# Patient Record
Sex: Female | Born: 1955 | Race: White | Hispanic: No | Marital: Married | Smoking: Never smoker
Health system: Southern US, Community
[De-identification: ages and names within clinical notes are randomized; demographics above are authoritative.]

## PROBLEM LIST (undated history)

## (undated) DIAGNOSIS — I341 Nonrheumatic mitral (valve) prolapse: Secondary | ICD-10-CM

## (undated) DIAGNOSIS — G4730 Sleep apnea, unspecified: Secondary | ICD-10-CM

## (undated) DIAGNOSIS — K219 Gastro-esophageal reflux disease without esophagitis: Secondary | ICD-10-CM

## (undated) DIAGNOSIS — M1990 Unspecified osteoarthritis, unspecified site: Secondary | ICD-10-CM

## (undated) DIAGNOSIS — E7800 Pure hypercholesterolemia, unspecified: Secondary | ICD-10-CM

## (undated) DIAGNOSIS — E119 Type 2 diabetes mellitus without complications: Secondary | ICD-10-CM

## (undated) DIAGNOSIS — T4145XA Adverse effect of unspecified anesthetic, initial encounter: Secondary | ICD-10-CM

## (undated) DIAGNOSIS — R0602 Shortness of breath: Secondary | ICD-10-CM

## (undated) DIAGNOSIS — R112 Nausea with vomiting, unspecified: Secondary | ICD-10-CM

## (undated) DIAGNOSIS — E039 Hypothyroidism, unspecified: Secondary | ICD-10-CM

## (undated) DIAGNOSIS — I499 Cardiac arrhythmia, unspecified: Secondary | ICD-10-CM

## (undated) DIAGNOSIS — M722 Plantar fascial fibromatosis: Secondary | ICD-10-CM

## (undated) DIAGNOSIS — J45909 Unspecified asthma, uncomplicated: Secondary | ICD-10-CM

## (undated) DIAGNOSIS — E079 Disorder of thyroid, unspecified: Secondary | ICD-10-CM

## (undated) DIAGNOSIS — Z9889 Other specified postprocedural states: Secondary | ICD-10-CM

## (undated) DIAGNOSIS — T8859XA Other complications of anesthesia, initial encounter: Secondary | ICD-10-CM

## (undated) HISTORY — DX: Pure hypercholesterolemia, unspecified: E78.00

## (undated) HISTORY — PX: APPENDECTOMY: SHX54

## (undated) HISTORY — PX: TOE SURGERY: SHX1073

## (undated) HISTORY — PX: GANGLION CYST EXCISION: SHX1691

## (undated) HISTORY — DX: Disorder of thyroid, unspecified: E07.9

## (undated) HISTORY — DX: Plantar fascial fibromatosis: M72.2

## (undated) HISTORY — DX: Type 2 diabetes mellitus without complications: E11.9

## (undated) HISTORY — PX: ROTATOR CUFF REPAIR: SHX139

## (undated) HISTORY — PX: CYST REMOVAL LEG: SHX6280

## (undated) HISTORY — DX: Nonrheumatic mitral (valve) prolapse: I34.1

---

## 1998-12-25 ENCOUNTER — Other Ambulatory Visit: Admission: RE | Admit: 1998-12-25 | Discharge: 1998-12-25 | Payer: Self-pay | Admitting: Obstetrics and Gynecology

## 1999-04-14 HISTORY — PX: LAPAROSCOPIC SALPINGO OOPHERECTOMY: SHX5927

## 1999-09-24 ENCOUNTER — Encounter: Payer: Self-pay | Admitting: Specialist

## 1999-09-24 ENCOUNTER — Encounter: Admission: RE | Admit: 1999-09-24 | Discharge: 1999-09-24 | Payer: Self-pay | Admitting: Specialist

## 2000-01-08 ENCOUNTER — Other Ambulatory Visit: Admission: RE | Admit: 2000-01-08 | Discharge: 2000-01-08 | Payer: Self-pay | Admitting: Obstetrics and Gynecology

## 2000-01-30 ENCOUNTER — Ambulatory Visit (HOSPITAL_COMMUNITY): Admission: RE | Admit: 2000-01-30 | Discharge: 2000-01-30 | Payer: Self-pay | Admitting: Obstetrics and Gynecology

## 2001-01-10 ENCOUNTER — Other Ambulatory Visit: Admission: RE | Admit: 2001-01-10 | Discharge: 2001-01-10 | Payer: Self-pay | Admitting: Obstetrics and Gynecology

## 2001-09-06 ENCOUNTER — Ambulatory Visit (HOSPITAL_COMMUNITY): Admission: RE | Admit: 2001-09-06 | Discharge: 2001-09-06 | Payer: Self-pay | Admitting: Surgery

## 2001-09-06 ENCOUNTER — Encounter: Payer: Self-pay | Admitting: Surgery

## 2002-01-16 ENCOUNTER — Other Ambulatory Visit: Admission: RE | Admit: 2002-01-16 | Discharge: 2002-01-16 | Payer: Self-pay | Admitting: Obstetrics and Gynecology

## 2002-05-21 ENCOUNTER — Encounter: Payer: Self-pay | Admitting: Emergency Medicine

## 2002-05-21 ENCOUNTER — Emergency Department (HOSPITAL_COMMUNITY): Admission: EM | Admit: 2002-05-21 | Discharge: 2002-05-21 | Payer: Self-pay | Admitting: Emergency Medicine

## 2002-06-02 ENCOUNTER — Encounter: Payer: Self-pay | Admitting: Family Medicine

## 2002-06-02 ENCOUNTER — Encounter: Admission: RE | Admit: 2002-06-02 | Discharge: 2002-06-02 | Payer: Self-pay | Admitting: Family Medicine

## 2002-06-12 HISTORY — PX: BACK SURGERY: SHX140

## 2002-06-13 ENCOUNTER — Inpatient Hospital Stay (HOSPITAL_COMMUNITY): Admission: RE | Admit: 2002-06-13 | Discharge: 2002-06-18 | Payer: Self-pay | Admitting: Neurosurgery

## 2002-06-13 ENCOUNTER — Encounter: Payer: Self-pay | Admitting: Neurosurgery

## 2003-01-29 ENCOUNTER — Other Ambulatory Visit: Admission: RE | Admit: 2003-01-29 | Discharge: 2003-01-29 | Payer: Self-pay | Admitting: Obstetrics and Gynecology

## 2004-02-04 ENCOUNTER — Other Ambulatory Visit: Admission: RE | Admit: 2004-02-04 | Discharge: 2004-02-04 | Payer: Self-pay | Admitting: Obstetrics and Gynecology

## 2005-02-09 ENCOUNTER — Other Ambulatory Visit: Admission: RE | Admit: 2005-02-09 | Discharge: 2005-02-09 | Payer: Self-pay | Admitting: Obstetrics and Gynecology

## 2005-06-10 ENCOUNTER — Encounter: Admission: RE | Admit: 2005-06-10 | Discharge: 2005-06-10 | Payer: Self-pay | Admitting: Family Medicine

## 2006-02-15 ENCOUNTER — Other Ambulatory Visit: Admission: RE | Admit: 2006-02-15 | Discharge: 2006-02-15 | Payer: Self-pay | Admitting: Obstetrics & Gynecology

## 2007-02-21 ENCOUNTER — Other Ambulatory Visit: Admission: RE | Admit: 2007-02-21 | Discharge: 2007-02-21 | Payer: Self-pay | Admitting: Obstetrics & Gynecology

## 2008-02-03 LAB — CONVERTED CEMR LAB: Glucose: 134 mg/dL

## 2008-02-28 ENCOUNTER — Other Ambulatory Visit: Admission: RE | Admit: 2008-02-28 | Discharge: 2008-02-28 | Payer: Self-pay | Admitting: Obstetrics and Gynecology

## 2008-03-22 ENCOUNTER — Ambulatory Visit: Payer: Self-pay | Admitting: Family Medicine

## 2008-03-22 DIAGNOSIS — E785 Hyperlipidemia, unspecified: Secondary | ICD-10-CM

## 2008-03-22 DIAGNOSIS — E119 Type 2 diabetes mellitus without complications: Secondary | ICD-10-CM

## 2008-03-28 ENCOUNTER — Encounter: Payer: Self-pay | Admitting: Family Medicine

## 2008-05-01 ENCOUNTER — Ambulatory Visit: Payer: Self-pay | Admitting: Family Medicine

## 2008-06-07 ENCOUNTER — Emergency Department (HOSPITAL_BASED_OUTPATIENT_CLINIC_OR_DEPARTMENT_OTHER): Admission: EM | Admit: 2008-06-07 | Discharge: 2008-06-07 | Payer: Self-pay | Admitting: Emergency Medicine

## 2009-03-20 ENCOUNTER — Encounter: Admission: RE | Admit: 2009-03-20 | Discharge: 2009-03-20 | Payer: Self-pay | Admitting: Family Medicine

## 2009-03-27 ENCOUNTER — Encounter: Admission: RE | Admit: 2009-03-27 | Discharge: 2009-03-27 | Payer: Self-pay | Admitting: Family Medicine

## 2009-04-09 ENCOUNTER — Ambulatory Visit: Payer: Self-pay | Admitting: Hematology and Oncology

## 2009-04-17 ENCOUNTER — Ambulatory Visit (HOSPITAL_COMMUNITY)
Admission: RE | Admit: 2009-04-17 | Discharge: 2009-04-17 | Payer: Self-pay | Source: Home / Self Care | Admitting: Hematology and Oncology

## 2009-04-17 LAB — CBC WITH DIFFERENTIAL/PLATELET
EOS%: 4.3 % (ref 0.0–7.0)
HCT: 39.9 % (ref 34.8–46.6)
MCH: 34.4 pg — ABNORMAL HIGH (ref 25.1–34.0)
MCV: 97.6 fL (ref 79.5–101.0)
MONO#: 0.3 10*3/uL (ref 0.1–0.9)
MONO%: 6.9 % (ref 0.0–14.0)
NEUT#: 2 10*3/uL (ref 1.5–6.5)
RBC: 4.09 10*6/uL (ref 3.70–5.45)
WBC: 3.7 10*3/uL — ABNORMAL LOW (ref 3.9–10.3)

## 2009-04-17 LAB — COMPREHENSIVE METABOLIC PANEL
CO2: 28 mEq/L (ref 19–32)
Calcium: 9.3 mg/dL (ref 8.4–10.5)
Potassium: 4.1 mEq/L (ref 3.5–5.3)
Sodium: 141 mEq/L (ref 135–145)
Total Bilirubin: 0.4 mg/dL (ref 0.3–1.2)
Total Protein: 6.6 g/dL (ref 6.0–8.3)

## 2009-04-17 LAB — MORPHOLOGY: RBC Comments: NORMAL

## 2009-04-22 LAB — RHEUMATOID FACTOR: Rhuematoid fact SerPl-aCnc: <20 IU/mL (ref 0–20)

## 2009-04-22 LAB — PROTEIN ELECTROPHORESIS, SERUM, WITH REFLEX
Albumin ELP: 63.8 % (ref 55.8–66.1)
Alpha-1-Globulin: 4 % (ref 2.9–4.9)
Beta 2: 4.8 % (ref 3.2–6.5)
Gamma Globulin: 13.4 % (ref 11.1–18.8)
Total Protein, Serum Electrophoresis: 6.8 g/dL (ref 6.0–8.3)

## 2009-04-22 LAB — ANA: Anti Nuclear Antibody(ANA): NEGATIVE

## 2009-04-22 LAB — FOLATE: Folate: >20 ng/mL

## 2009-04-22 LAB — ANTI-NEUTROPHILIC CYTOPLASMIC ANTIBODY PANEL: ANCA Screen: NEGATIVE

## 2009-04-25 ENCOUNTER — Ambulatory Visit (HOSPITAL_COMMUNITY): Admission: RE | Admit: 2009-04-25 | Discharge: 2009-04-25 | Payer: Self-pay | Admitting: Hematology and Oncology

## 2009-07-22 ENCOUNTER — Encounter: Admission: RE | Admit: 2009-07-22 | Discharge: 2009-07-22 | Payer: Self-pay | Admitting: Sports Medicine

## 2009-09-27 ENCOUNTER — Ambulatory Visit: Payer: Self-pay | Admitting: Hematology and Oncology

## 2009-10-01 LAB — CBC WITH DIFFERENTIAL/PLATELET
BASO%: 1.3 % (ref 0.0–2.0)
Eosinophils Absolute: 0.2 10*3/uL (ref 0.0–0.5)
LYMPH%: 39.3 % (ref 14.0–49.7)
MCH: 32.8 pg (ref 25.1–34.0)
MCHC: 35.3 g/dL (ref 31.5–36.0)
MONO#: 0.3 10*3/uL (ref 0.1–0.9)
MONO%: 9.7 % (ref 0.0–14.0)
RBC: 4.63 10*6/uL (ref 3.70–5.45)
RDW: 12.5 % (ref 11.2–14.5)

## 2009-11-21 ENCOUNTER — Encounter: Admission: RE | Admit: 2009-11-21 | Discharge: 2009-11-21 | Payer: Self-pay | Admitting: Endocrinology

## 2010-05-29 ENCOUNTER — Other Ambulatory Visit: Payer: Self-pay | Admitting: Endocrinology

## 2010-05-29 ENCOUNTER — Other Ambulatory Visit: Payer: Self-pay | Admitting: Gastroenterology

## 2010-05-29 DIAGNOSIS — R110 Nausea: Secondary | ICD-10-CM

## 2010-05-29 DIAGNOSIS — R1013 Epigastric pain: Secondary | ICD-10-CM

## 2010-05-29 DIAGNOSIS — E049 Nontoxic goiter, unspecified: Secondary | ICD-10-CM

## 2010-06-02 ENCOUNTER — Ambulatory Visit
Admission: RE | Admit: 2010-06-02 | Discharge: 2010-06-02 | Disposition: A | Discharge status: Home / Self Care | Payer: PRIVATE HEALTH INSURANCE | Source: Ambulatory Visit | Attending: Gastroenterology | Admitting: Gastroenterology

## 2010-06-02 DIAGNOSIS — R110 Nausea: Secondary | ICD-10-CM

## 2010-06-02 DIAGNOSIS — R1013 Epigastric pain: Secondary | ICD-10-CM

## 2010-06-03 ENCOUNTER — Other Ambulatory Visit (HOSPITAL_COMMUNITY): Payer: Self-pay | Admitting: Gastroenterology

## 2010-06-03 DIAGNOSIS — R1011 Right upper quadrant pain: Secondary | ICD-10-CM

## 2010-06-23 ENCOUNTER — Encounter (HOSPITAL_COMMUNITY)
Admission: RE | Admit: 2010-06-23 | Discharge: 2010-06-23 | Disposition: A | Discharge status: Home / Self Care | Payer: PRIVATE HEALTH INSURANCE | Source: Ambulatory Visit | Attending: Gastroenterology | Admitting: Gastroenterology

## 2010-06-23 DIAGNOSIS — R1011 Right upper quadrant pain: Secondary | ICD-10-CM | POA: Insufficient documentation

## 2010-06-23 MED ORDER — TECHNETIUM TC 99M MEBROFENIN IV KIT
5.1000 | PACK | Freq: Once | INTRAVENOUS | Status: AC | PRN
  Administered 2010-06-23: 5 via INTRAVENOUS

## 2010-06-23 MED ORDER — SINCALIDE 5 MCG IJ SOLR: 0.0200 ug/kg | Freq: Once | INTRAMUSCULAR | Status: DC

## 2010-07-02 ENCOUNTER — Other Ambulatory Visit (HOSPITAL_COMMUNITY): Payer: Self-pay | Admitting: Gastroenterology

## 2010-07-02 DIAGNOSIS — R110 Nausea: Secondary | ICD-10-CM

## 2010-07-03 ENCOUNTER — Ambulatory Visit (HOSPITAL_COMMUNITY)
Admission: RE | Admit: 2010-07-03 | Discharge: 2010-07-03 | Disposition: A | Discharge status: Home / Self Care | Payer: PRIVATE HEALTH INSURANCE | Source: Ambulatory Visit | Attending: Gastroenterology | Admitting: Gastroenterology

## 2010-07-03 ENCOUNTER — Encounter (HOSPITAL_COMMUNITY): Payer: Self-pay

## 2010-07-03 DIAGNOSIS — R110 Nausea: Secondary | ICD-10-CM

## 2010-07-03 DIAGNOSIS — Z9089 Acquired absence of other organs: Secondary | ICD-10-CM | POA: Insufficient documentation

## 2010-07-03 MED ORDER — IOHEXOL 300 MG/ML  SOLN
100.0000 mL | Freq: Once | INTRAMUSCULAR | Status: AC | PRN
  Administered 2010-07-03: 100 mL via INTRAVENOUS

## 2010-07-14 ENCOUNTER — Other Ambulatory Visit (HOSPITAL_COMMUNITY): Payer: Self-pay | Admitting: General Surgery

## 2010-07-16 ENCOUNTER — Encounter (HOSPITAL_COMMUNITY)
Admission: RE | Admit: 2010-07-16 | Discharge: 2010-07-16 | Disposition: A | Discharge status: Home / Self Care | Payer: PRIVATE HEALTH INSURANCE | Source: Ambulatory Visit | Attending: General Surgery | Admitting: General Surgery

## 2010-07-16 ENCOUNTER — Encounter (HOSPITAL_COMMUNITY): Payer: Self-pay

## 2010-07-16 DIAGNOSIS — R109 Unspecified abdominal pain: Secondary | ICD-10-CM | POA: Insufficient documentation

## 2010-07-16 DIAGNOSIS — K219 Gastro-esophageal reflux disease without esophagitis: Secondary | ICD-10-CM | POA: Insufficient documentation

## 2010-07-16 DIAGNOSIS — R112 Nausea with vomiting, unspecified: Secondary | ICD-10-CM | POA: Insufficient documentation

## 2010-07-16 MED ORDER — TECHNETIUM TC 99M SULFUR COLLOID
2.1000 | Freq: Once | INTRAVENOUS | Status: AC | PRN
  Administered 2010-07-16: 2.1 via ORAL

## 2010-07-21 ENCOUNTER — Ambulatory Visit
Admission: RE | Admit: 2010-07-21 | Discharge: 2010-07-21 | Disposition: A | Discharge status: Home / Self Care | Payer: PRIVATE HEALTH INSURANCE | Source: Ambulatory Visit | Attending: Endocrinology | Admitting: Endocrinology

## 2010-07-21 DIAGNOSIS — E049 Nontoxic goiter, unspecified: Secondary | ICD-10-CM

## 2010-07-29 LAB — BASIC METABOLIC PANEL
CO2: 24 mEq/L (ref 19–32)
Calcium: 10 mg/dL (ref 8.4–10.5)
GFR calc non Af Amer: >60 mL/min (ref >60)

## 2010-08-29 NOTE — Discharge Summary (Signed)
   NAMETAYDEM, CAVAGNARO                            ACCOUNT NO.:  [id number]   MEDICAL RECORD NO.:  [id number]                   PATIENT TYPE:  INP   LOCATION:  3006                                 FACILITY:  [facility]   PHYSICIAN:  Stefani Dama, M.D.               DATE OF BIRTH:  09/23/1955   DATE OF ADMISSION:  06/13/2002  DATE OF DISCHARGE:  06/18/2002                                 DISCHARGE SUMMARY   ADMISSION DIAGNOSES:  1. Lumbar spondylolysis.  2. Spondylolisthesis L5-S1.  3. Herniated nucleus pulposus L5-S1, with lumbar radiculopathy.   DISCHARGE DIAGNOSES:  1. Lumbar spondylolysis.  2. Spondylolisthesis L5-S1.  3. Herniated nucleus pulposus L5-S1, with lumbar radiculopathy.   OPERATION:  An L5 Gill procedure, diskectomy with interbody arthrodesis and  stabilization with non-segmental pedicle fixation, and posterolateral  arthrodesis with autograft and allograft.   CONDITION ON DISCHARGE:  Improving.   HOSPITAL COURSE:  The patient is a 55 year old individual who has had  significant back and leg pain.  She was advised to undergo a surgical  decompression of a spondylolisthesis at the L5-S1 level.  This occurred on  June 13, 2002.  Postoperatively she required some pain medication at first,  and was gradually switched over to oral pain medication.   DISPOSITION:  At the time of discharge she is tolerating Percocet one tab  q.3-4h. p.r.n. pain control.  Her incision is clean and dry.  She is also  given a prescription for Valium 5 mg, #40, without refills.   FOLLOW UP:  She will be seen in the office in one week's time.   CONDITION ON DISCHARGE:  Her incision is clean and dry.  Her condition on  discharge is improved.                                                Stefani Dama, M.D.    Merla Riches  D:  06/18/2002  T:  06/19/2002  Job:  [id number]

## 2010-08-29 NOTE — Op Note (Signed)
Alexandria Parker, Alexandria Parker                            ACCOUNT NO.:  [id number]   MEDICAL RECORD NO.:  [id number]                   PATIENT TYPE:  INP   LOCATION:  2899                                 FACILITY:  [facility]   PHYSICIAN:  Danae Orleans. Venetia Maxon, M.D.               DATE OF BIRTH:  April 28, 1955   DATE OF PROCEDURE:  06/13/2002  DATE OF DISCHARGE:                                 OPERATIVE REPORT   PREOPERATIVE DIAGNOSES:  1. L5 spondylolysis.  2. Spondylolisthesis, L5-S1.  3. Herniated lumbar disk, L5-S1.  4. Lumbar radiculopathy.   POSTOPERATIVE DIAGNOSES:  1. L5 spondylolysis.  2. Spondylolisthesis, L5-S1.  3. Herniated lumbar disk, L5-S1.  4. Lumbar radiculopathy.   PROCEDURES:  1. L5 Gill procedure.  2. L5-S1 diskectomy with transverse lumbar interbody fusion (11 mm Synthes     bone dowel with morcellized autograft).  3. Pedicle screw fixation, L5 through S1 bilaterally, using FD90 5.75 x 40     mm screws.  4. Posterolateral arthrodesis, L5-S1, with morcellized autograft and 10 mL     of Vitoss.   SURGEON:  Danae Orleans. Venetia Maxon, M.D.   ASSISTANT:  Payton Doughty, M.D.   ANESTHESIA:  General endotracheal anesthesia.   ESTIMATED BLOOD LOSS:  200 mL.   COMPLICATIONS:  None.   DISPOSITION:  To recovery.   INDICATIONS:  The patient is a 55 year old woman with a large free-fragment  disk herniation from the L5-S1 interspace causing compression of the left L5  nerve root with significant L5 radiculopathy.  She has grade 1-2  spondylolisthesis of L5 on S1 with spondylolysis of L5, which is mobile on  flexion-extension views.  It was therefore elected to take her to surgery  for decompression and fixation as well as diskectomy.   DESCRIPTION OF PROCEDURE:  The patient was brought to the operating room.  Following the satisfactory and uncomplicated induction of general  endotracheal anesthesia and placement of intravenous lines, the patient  placed in a prone position on the operating  table.  The low back was prepped  and draped in the usual sterile fashion.  The area of planned incision was  infiltrated with 0.25% Marcaine and 0.5% lidocaine with 1:200,000 strength  of epinephrine.  The incision was made in the midline, carried through  copious adipose tissue to the lumbar dorsal fascia, which was incised using  electrocautery.  Subperiosteal dissection was then performed exposing the L5  transverse processes and the sacral alae.  Both of these were decorticated  and stripped of periosteum for later bone grafting.  The Versatrac retractor  was placed to facilitate exposure.  The L5 posterior elements were extremely  loose, and there was clear evidence of a pars defect on both sides of L5.  An intraoperative x-ray was obtained with marker probes at the L5 and sacral  pedicles, which was confirmed.  Subsequently an L5 Gill procedure was  performed  where the posterior elements of L5 were removed and subsequently  the thecal sac was identified and decompressed, as were both L5 and S1 nerve  roots.  On the left side the thecal sac was mobilized with D'Errico nerve  root retractor under loupe magnification, and multiple large fragments of  herniated disk material were identified and removed.  These fragments were  directly underlying the L5 nerve root, and removal of these fragments led to  significant decompression of the L5 nerve root.  There was an obvious rent  in the annulus, and more disk material was removed from this.  Subsequently  the laminar spreader was placed on the right side of the midline and the  interspace was then entered with a 15 blade and then disk material was  removed in a piecemeal fashion.  Using a variety of Synthes curettes and box  cutting curettes, the end plates were stripped of residual disk material.  The fluoroscopy unit was then brought into the field and using  intraoperative fluoroscopy, an 11 mm bone dowel trial sizer was placed and  this  was found to fit appropriately.  The 11 mm Synthes T-LIF dowel was then  reconstituted in kanamycin and saline, and this was inserted in the  interspace and countersunk appropriately.  Intraoperative x-ray confirmed  positioning of bone graft.  Subsequently morcellized bone autograft which  had been chopped up from the L5 posterior elements which had been previously  harvested was then inserted in the interspace and counter sunk  appropriately.  Subsequently pedicle screws were placed, 40 x 5.75 mm screws  were placed, one in each S1 pedicle and an additional screw at each L5  pedicle.  All screws had excellent purchase.  The tracks of the screws were  palpated with a probe and found to have no cut-out, and positioning of  screws was confirmed on lateral and AP fluoroscopy.  The 60 mm rod was then  cut in half and each little rod was lordosed somewhat.  Morcellized bone was  admixed with 10 mL of Vitoss, which was reconstituted with blood aspirated  from the track of the pedicle screws, and a tap was inserted over each  sacral ala and L5 transverse process.  The rods were then placed and locked  down in situ.  Prior to placing bone graft, the wound had been copiously  irrigated with kanamycin and saline.  The self-retaining retractor was then  removed.  The lumbodorsal fascia was closed with 0 Vicryl suture, the  subcutaneous tissue was reapproximated with 2-0 Vicryl interrupted inverted  sutures, and the skin edges were reapproximated with interrupted 3-0 Vicryl  subcuticular stitch.  The wound was dressed with Benzoin, Steri-Strips,  Telfa gauze, and tape.  The patient was extubated in the operating room and  taken to the recovery room in stable and satisfactory condition, having  tolerated her operation well.  Counts were correct at the end of the case.                                               Danae Orleans. Venetia Maxon, M.D.   JDS/MEDQ  D:  06/13/2002  T:  06/13/2002  Job:  [id number]

## 2010-12-02 ENCOUNTER — Other Ambulatory Visit: Payer: Self-pay | Admitting: Endocrinology

## 2010-12-02 DIAGNOSIS — E049 Nontoxic goiter, unspecified: Secondary | ICD-10-CM

## 2011-05-25 ENCOUNTER — Ambulatory Visit
Admission: RE | Admit: 2011-05-25 | Discharge: 2011-05-25 | Disposition: A | Discharge status: Home / Self Care | Payer: PRIVATE HEALTH INSURANCE | Source: Ambulatory Visit | Attending: Endocrinology | Admitting: Endocrinology

## 2011-05-25 DIAGNOSIS — E049 Nontoxic goiter, unspecified: Secondary | ICD-10-CM

## 2011-10-07 IMAGING — NM NM HEPATO W/GB/PHARM/[PERSON_NAME]
3 series · 18 of 18 positions shown · non-contrast
Comparison: None.

***ADDENDUM*** CREATED: 08/12/2010 [DATE]

Due to an administrative error, the original report had the
incorrect  exam title.  Please see below for the corrected exam
title.
NM HEPATOBILIARY INCLUDE GB
***END ADDENDUM*** SIGNED BY: Uziel Selders, M.D.
CLINICAL DATA: Right upper quadrant pain
RADIOLOGY EXAMINATION
Radiopharmaceutical: 5.1 mCi technetium Choletec and 1.6 micro
grams CCK intravenously.

[Series 1: he hepato · 4.75mm/px · 6 of 60 frames shown (1 of 3)]
[frame 6/60]
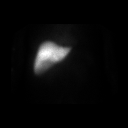
[frame 16/60]
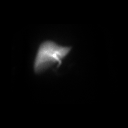
[frame 26/60]
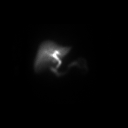
[frame 36/60]
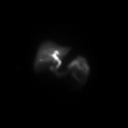
[frame 46/60]
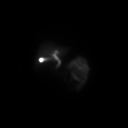
[frame 56/60]
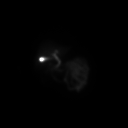

[Series 1: he hepato · 4.75mm/px · 6 of 30 frames shown (2 of 3)]
[frame 3/30]
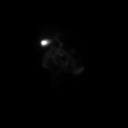
[frame 8/30]
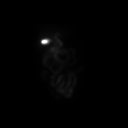
[frame 13/30]
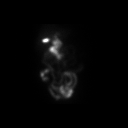
[frame 18/30]
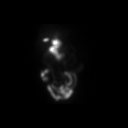
[frame 23/30]
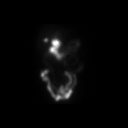
[frame 28/30]
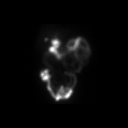

[Series 1: he hepato · 4.75mm/px · 6 of 12 frames shown (3 of 3)]
[frame 2/12]
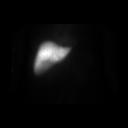
[frame 4/12]
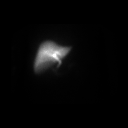
[frame 6/12]
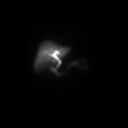
[frame 8/12]
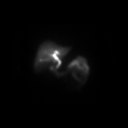
[frame 10/12]
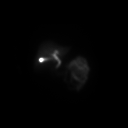
[frame 12/12]
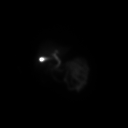

[18 of 18 positions shown; findings below may reference images not displayed]

FINDINGS: There is prompt visualization of the bile ducts,
gallbladder, and small bowel.  The ejection fraction is calculated
at 94.6%.  Normal is greater than 30%.  The patient did complain of
abdominal cramps during CCK infusion, but no specific right upper
quadrant pain.
IMPRESSION: 1.  Patent cystic and common bile ducts.
2.  The gallbladder contracts physiologically.
3.  The patient reported abdominal cramping  during CCK infusion.

## 2011-11-04 IMAGING — US US SOFT TISSUE HEAD/NECK
1 series · 14 of 25 positions shown · non-contrast
Comparison: 11/21/2009

CLINICAL DATA: Follow up goiter.

THYROID ULTRASOUND
TECHNIQUE: Ultrasound examination of the thyroid gland and adjacent
soft tissues was performed.

[Series 1: us soft tissue head/neck · 0.08mm/px · 14 of 54 slices shown]
[im 1/54]
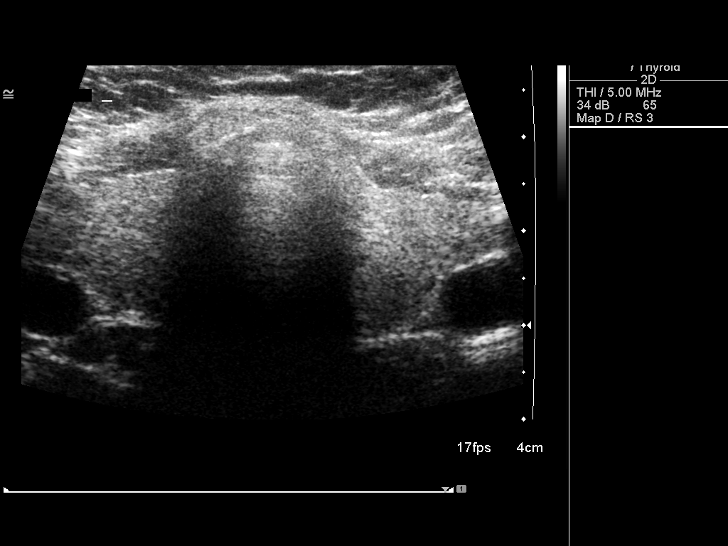
[im 5/54]
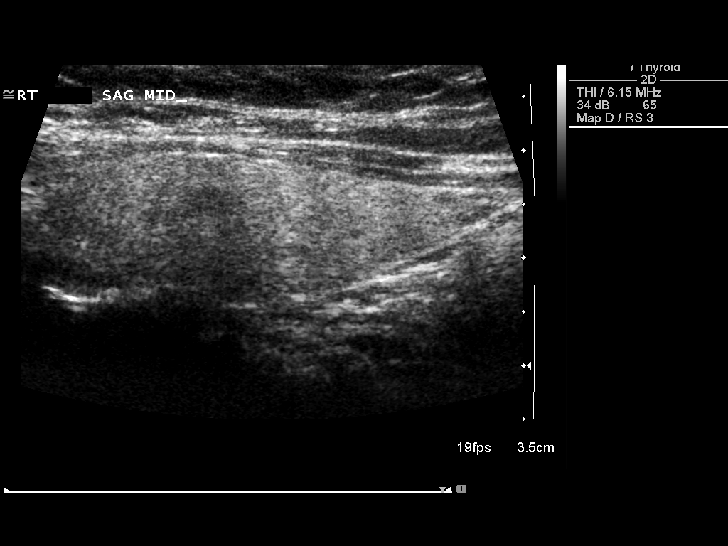
[im 9/54]
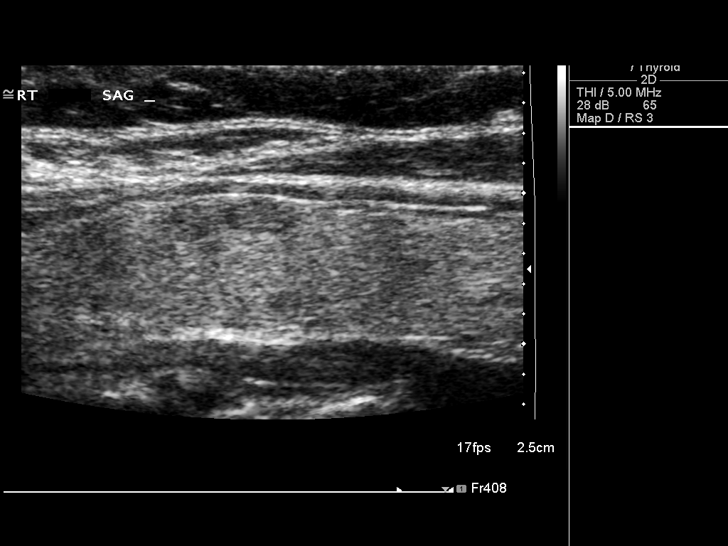
[im 14/54]
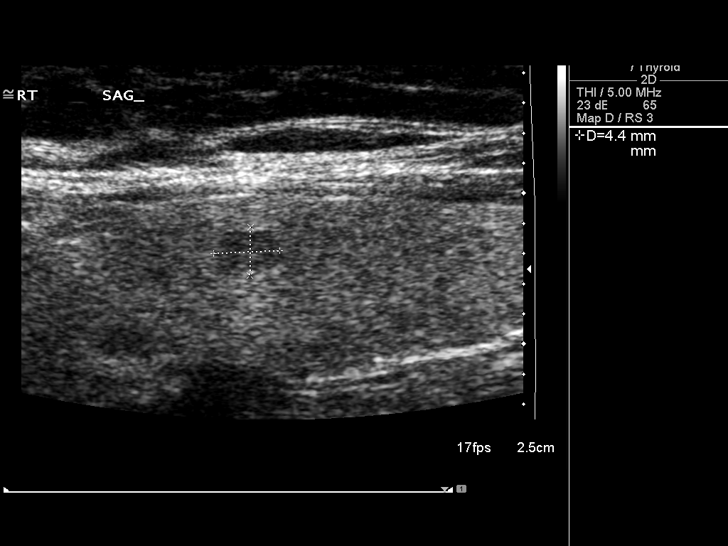
[im 18/54]
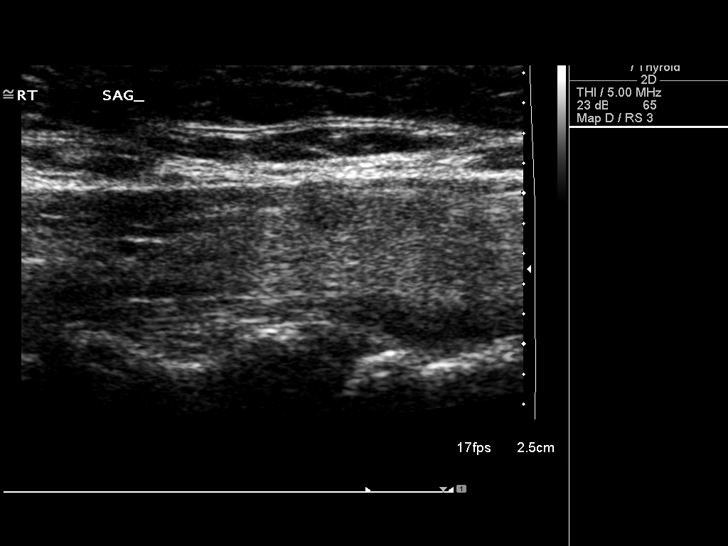
[im 20/54]
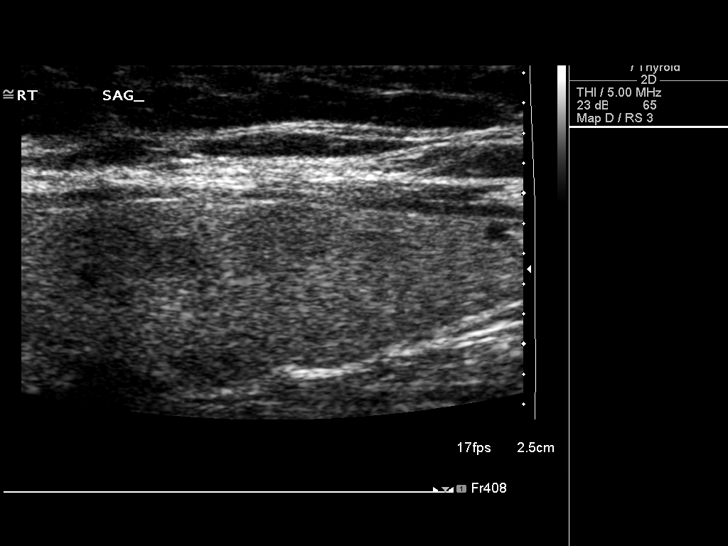
[im 25/54]
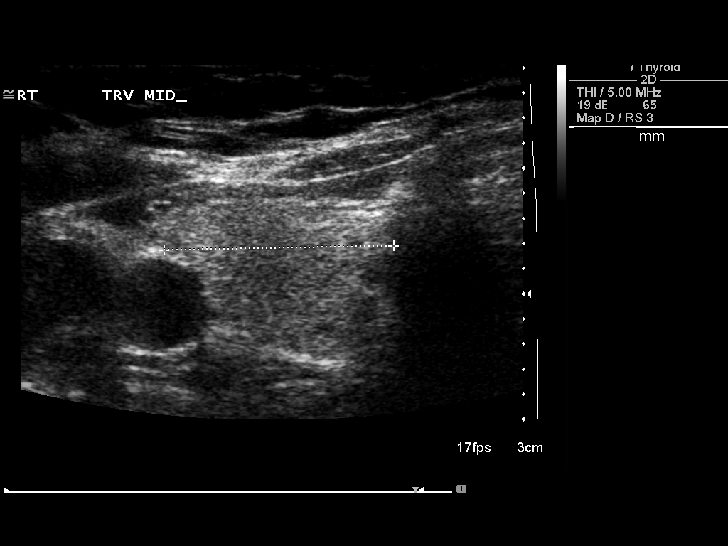
[im 29/54]
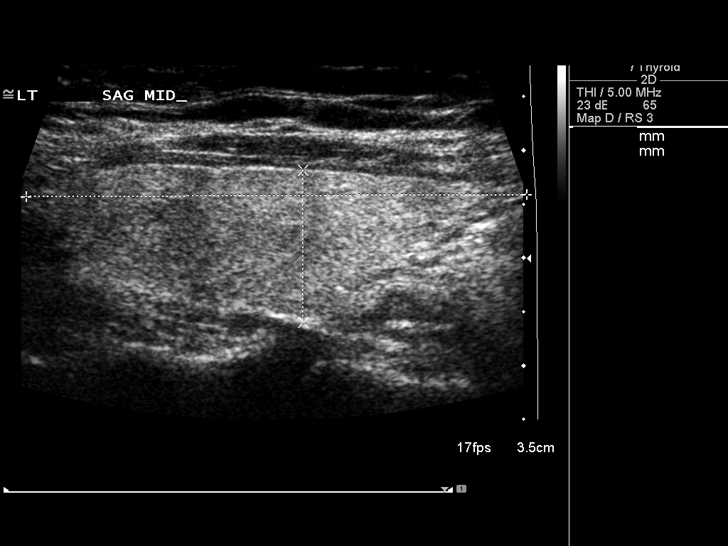
[im 34/54]
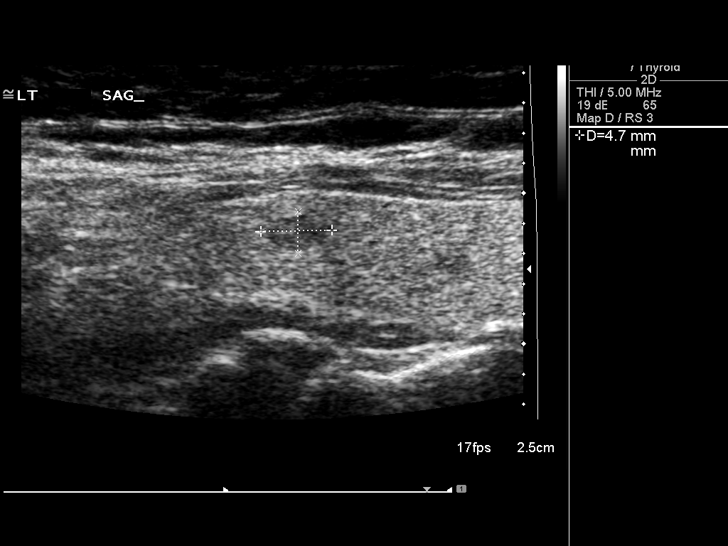
[im 36/54]
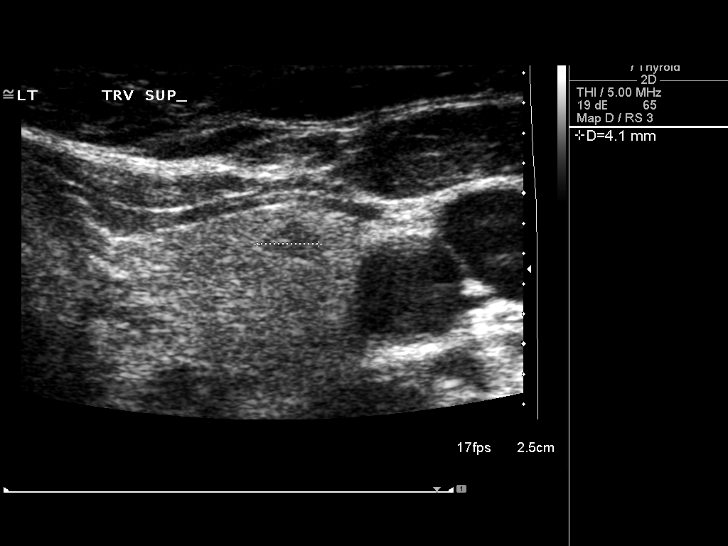
[im 40/54]
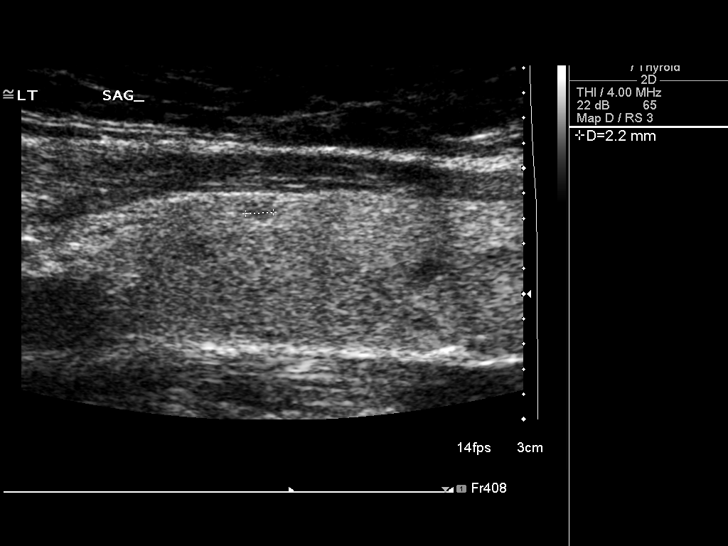
[im 45/54]
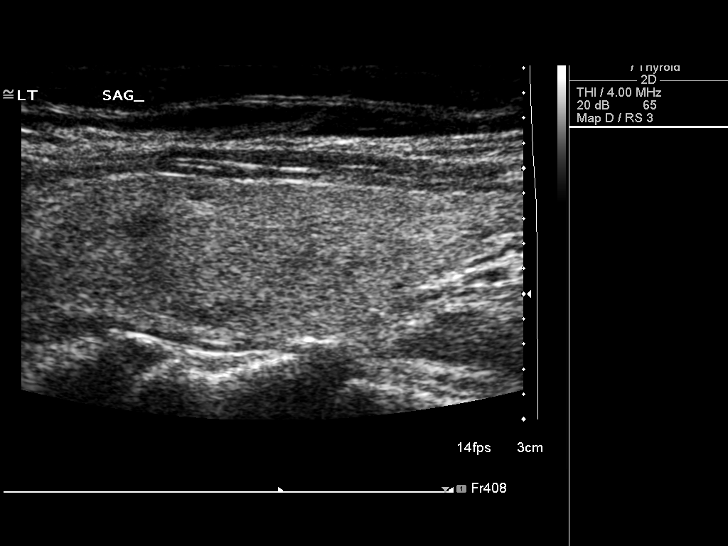
[im 49/54]
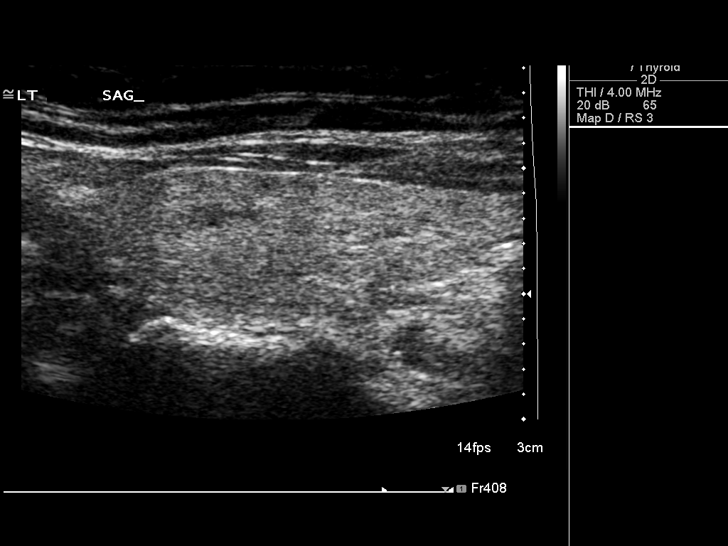
[im 54/54]
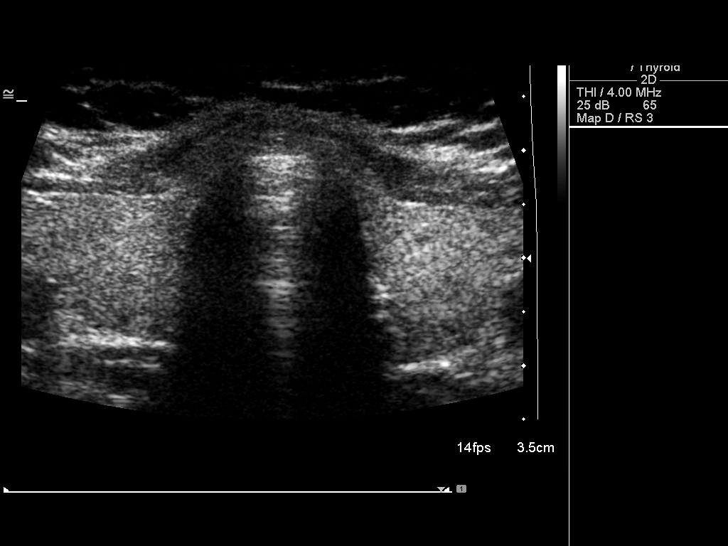

[14 of 25 positions shown; findings below may reference images not displayed]

FINDINGS: Right thyroid lobe:  Right thyroid measures 4.5 x 1.4 x 1.8 cm.
Left thyroid lobe:  Left thyroid measures 4.7 x 1.4 x 2.0 cm.
Isthmus:  4 mm in thickness

Focal nodules:  Multiple bilateral hypoechoic nodules bilaterally,
some of which may be solid, although none of which measure greater
than 5 mm. When compared to the prior study, there is no interval
change.

Lymphadenopathy:  None visualized.
IMPRESSION: Multiple small hypoechoic nodules measuring up to 5 mm bilaterally,
unchanged.

## 2012-06-28 ENCOUNTER — Encounter: Payer: Self-pay | Admitting: *Deleted

## 2012-06-29 ENCOUNTER — Encounter: Payer: Self-pay | Admitting: Obstetrics and Gynecology

## 2012-06-29 ENCOUNTER — Ambulatory Visit (INDEPENDENT_AMBULATORY_CARE_PROVIDER_SITE_OTHER): Payer: PRIVATE HEALTH INSURANCE | Admitting: Obstetrics and Gynecology

## 2012-06-29 VITALS — BP 118/70 | Ht 64.25 in | Wt 178.0 lb

## 2012-06-29 DIAGNOSIS — Z01419 Encounter for gynecological examination (general) (routine) without abnormal findings: Secondary | ICD-10-CM

## 2012-06-29 DIAGNOSIS — N801 Endometriosis of ovary: Secondary | ICD-10-CM | POA: Insufficient documentation

## 2012-06-29 MED ORDER — PROGESTERONE MICRONIZED 100 MG PO CAPS: 100.0000 mg | ORAL_CAPSULE | Freq: Every day | ORAL | Status: DC

## 2012-06-29 MED ORDER — ESTRADIOL 0.05 MG/24HR TD PTTW: 1.0000 | MEDICATED_PATCH | TRANSDERMAL | Status: DC

## 2012-06-29 NOTE — Patient Instructions (Signed)
We recommended that you start or continue a regular exercise program for good health. Regular exercise means any activity that makes your heart beat faster and makes you sweat.  We recommend exercising at least 30 minutes per day at least 3 days a week, preferably 4 or 5.  We also recommend a diet low in fat and sugar.  Inactivity, poor dietary choices and obesity can cause diabetes, heart attack, stroke, and kidney damage, among others.    All women over 40 years old should have a yearly mammogram. Many facilities now offer a "3D" mammogram, which may cost around $50 extra out of pocket. If possible,  we recommend you accept the option to have the 3D mammogram performed.  It both reduces the number of women who will be called back for extra views which then turn out to be normal, and it is better than the routine mammogram at detecting truly abnormal areas.    Women should limit their alcohol intake to no more than 7 drinks/beers/glasses of wine (combined, not each!) per week. Moderation of alcohol intake to this level decreases your risk of breast cancer and liver damage. And of course, no recreational drugs are part of a healthy lifestyle.  And absolutely no smoking or even second hand smoke. Most people know smoking can cause heart and lung diseases, but did you know it also contributes to weakening of your bones? Aging of your skin?  Yellowing of your teeth and nails?  Pap smears, to check for cervical cancer or precancers,  have traditionally been done yearly, although recent scientific advances have shown that most women can have pap smears less often.  However, every woman still should have a physical exam from her gynecologist every year. It will include a breast check, inspection of the vulva and vagina to check for abnormal growths or skin changes, a visual exam of the cervix, and then an exam to evaluate the size and shape of the uterus and ovaries.  And after 56 years of age, a rectal exam is  indicated to check for rectal cancers. We will also provide age appropriate advice regarding health maintenance, like when you should have certain vaccines, screening for sexually transmitted diseases, bone density testing, colonoscopy, mammograms, etc.   Adequate intake of calcium and Vitamin D are recommended.  The recommendations for exact amounts of these supplements seem to change often, but generally speaking 600 mg of calcium (either carbonate or citrate) and 800 units of Vitamin D per day seems prudent. Certain women may benefit from higher intake of Vitamin D.  If you are among these women, your doctor will have told you during your visit.     

## 2012-06-29 NOTE — Progress Notes (Deleted)
57 y.o. MarriedCaucasian female   G3P2 here for annual exam.    Patient's last menstrual period was 11/12/2010.          Sexually active: yes  The current method of family planning is vasectomy.    Exercising: no  The patient does not participate in regular exercise at present. Last mammogram:  06/15/2012 Smoking:no Alcohol:2-3 times a week Last colonoscopy:05/2010 Last Bone Density:  03/2008  Hgb:                Urine:   reports that she has never smoked. She does not have any smokeless tobacco history on file. She reports that she drinks about 1.0 ounces of alcohol per week. She reports that she does not use illicit drugs.  Health Maintenance  Topic Date Due  . Influenza Vaccine  12/12/1955  . Tetanus/tdap  12/11/1975  . Colonoscopy  12/11/2006  . Pap Smear  05/11/2014  . Mammogram  06/16/2014    Family History  Problem Relation Age of Onset  . Diabetes Mother   . Hyperlipidemia Father     Patient Active Problem List  Diagnosis  . AODM  . OTHER AND UNSPECIFIED HYPERLIPIDEMIA    Past Medical History  Diagnosis Date  . Diabetes mellitus   . Diabetes   . MVP (mitral valve prolapse)   . Plantar fasciitis   . Hypercholesteremia   . Thyroid disease     Past Surgical History  Procedure Laterality Date  . Laparoscopic salpingo oopherectomy Right 04/14/1999  . Rotator cuff repair Bilateral 1999/2001  . Cesarean section    . Appendectomy    . Back surgery  06/2002    L5 HNP  . Cyst removal leg Left     thigh,Lt wrist    Allergies: Benadryl; Codeine; Erythromycin; and Nsaids  Current Outpatient Prescriptions  Medication Sig Dispense Refill  . ALPRAZolam (XANAX) 1 MG tablet Take 1 mg by mouth as needed for sleep or anxiety (1-2 tabs).      . Cholecalciferol (VITAMIN D3) 2000 UNITS TABS Take 2,000 Units by mouth daily.      . clonazePAM (KLONOPIN) 0.5 MG tablet Take 0.5 mg by mouth at bedtime as needed for anxiety (1-2 tabs).      . CRESTOR 20 MG tablet Take 20 mg by  mouth daily.       Marland Kitchen Dexlansoprazole 30 MG capsule Take 30 mg by mouth daily.      Marland Kitchen estradiol (MINIVELLE) 0.05 MG/24HR Place 1 patch onto the skin 2 (two) times a week.      . fish oil-omega-3 fatty acids 1000 MG capsule Take 2 g by mouth 3 (three) times daily.      . Glucosamine Sulfate 750 MG CAPS Take 750 mg by mouth 2 (two) times daily.      Marland Kitchen levothyroxine (SYNTHROID, LEVOTHROID) 25 MCG tablet Take 25 mcg by mouth daily.      Marland Kitchen lisinopril (PRINIVIL,ZESTRIL) 2.5 MG tablet Take 2.5 mg by mouth daily.      . metFORMIN (GLUMETZA) 500 MG (MOD) 24 hr tablet Take 1,000 mg by mouth 2 (two) times daily with breakfast and lunch.      . Multiple Vitamin (MULTIVITAMIN WITH MINERALS) TABS Take 1 tablet by mouth 2 (two) times daily.       . progesterone (PROMETRIUM) 100 MG capsule Take 100 mg by mouth at bedtime.      . sertraline (ZOLOFT) 100 MG tablet Take 50 mg by mouth daily.  No current facility-administered medications for this visit.    ROS: Pertinent items are noted in HPI.  Exam:    BP 118/70  Ht 5' 4.25" (1.632 m)  Wt 178 lb (80.74 kg)  BMI 30.31 kg/m2  LMP 11/12/2010   Wt Readings from Last 3 Encounters:  06/29/12 178 lb (80.74 kg)  05/01/08 176 lb 11.2 oz (80.151 kg)  03/22/08 175 lb 3.2 oz (79.47 kg)     Ht Readings from Last 3 Encounters:  06/29/12 5' 4.25" (1.632 m)  03/22/08 5' 4.5" (1.638 m)    General appearance: alert, cooperative and appears stated age Head: Normocephalic, without obvious abnormality, atraumatic Neck: no adenopathy, supple, symmetrical, trachea midline and thyroid not enlarged, symmetric, no tenderness/mass/nodules Lungs: clear to auscultation bilaterally Breasts: Inspection negative, No nipple retraction or dimpling, No nipple discharge or bleeding, No axillary or supraclavicular adenopathy, Normal to palpation without dominant masses Heart: regular rate and rhythm Abdomen: soft, non-tender; bowel sounds normal; no masses,  no  organomegaly Extremities: extremities normal, atraumatic, no cyanosis or edema Skin: Skin color, texture, turgor normal. No rashes or lesions Lymph nodes: Cervical, supraclavicular, and axillary nodes normal. No abnormal inguinal nodes palpated Neurologic: Grossly normal   Pelvic: External genitalia:  no lesions              Urethra:  normal appearing urethra with no masses, tenderness or lesions              Bartholins and Skenes: normal                 Vagina: normal appearing vagina with normal color and discharge, no lesions              Cervix: normal appearance              Pap taken: {yes no:314532}        Bimanual Exam:  Uterus:  uterus is normal size, shape, consistency and nontender                                      Adnexa: normal adnexa in size, nontender and no masses                                      Rectovaginal: Confirms                                      Anus:  normal sphincter tone, no lesions  A: well woman     P: {plan; gyn:[id number]::"mammogram","pap smear","return annually or prn"}     An After Visit Summary was printed and given to the patient.

## 2012-06-29 NOTE — Progress Notes (Signed)
57 y.o. MarriedCaucasian female   G3P2 here for annual exam. A1C's are running about 6.  Sees Dr. Horald Pollen q 6 months.  Likes HRT but would like to not have to take any meds she doesn't have to.  Is on it for maintenance of vulvo-vag health and she feels her brain works better.    Patient's last menstrual period was 11/12/2010.          Sexually active: yes  The current method of family planning is none.    Exercising: no  none Last mammogram:  Two weeks ago Smoking:no Alcohol: 2 -3 times per week Last colonoscopy: 2012 Last Bone Density: 5-6 years ago  Hgb:                Urine:   reports that she has never smoked. She does not have any smokeless tobacco history on file. She reports that she drinks about 1.0 ounces of alcohol per week. She reports that she does not use illicit drugs.  Health Maintenance  Topic Date Due  . Influenza Vaccine  12/12/1955  . Tetanus/tdap  12/11/1975  . Colonoscopy  12/11/2006  . Pap Smear  05/11/2014  . Mammogram  06/16/2014    Family History  Problem Relation Age of Onset  . Diabetes Mother   . Hyperlipidemia Father     Patient Active Problem List  Diagnosis  . AODM  . OTHER AND UNSPECIFIED HYPERLIPIDEMIA    Past Medical History  Diagnosis Date  . Diabetes mellitus   . Diabetes   . MVP (mitral valve prolapse)   . Plantar fasciitis   . Hypercholesteremia   . Thyroid disease   Thyroid nodules followed by Dr. Johney Frame regular thyroid ultrasound.  Past Surgical History  Procedure Laterality Date  . Laparoscopic salpingo oopherectomy Right 04/14/1999  . Rotator cuff repair Bilateral 1999/2001  . Cesarean section    . Appendectomy    . Back surgery  06/2002    L5 HNP  . Cyst removal leg Left     thigh,Lt wrist    Allergies: Benadryl; Codeine; Erythromycin; and Nsaids  Current Outpatient Prescriptions  Medication Sig Dispense Refill  . ALPRAZolam (XANAX) 1 MG tablet Take 1 mg by mouth as needed for sleep or anxiety (1-2 tabs).       . Cholecalciferol (VITAMIN D3) 2000 UNITS TABS Take 2,000 Units by mouth daily.      . clonazePAM (KLONOPIN) 0.5 MG tablet Take 0.5 mg by mouth at bedtime as needed for anxiety (1-2 tabs).      . CRESTOR 20 MG tablet Take 20 mg by mouth daily.       Marland Kitchen Dexlansoprazole 30 MG capsule Take 30 mg by mouth daily.      Marland Kitchen estradiol (MINIVELLE) 0.05 MG/24HR Place 1 patch onto the skin 2 (two) times a week.      . fish oil-omega-3 fatty acids 1000 MG capsule Take 2 g by mouth 3 (three) times daily.      . Glucosamine Sulfate 750 MG CAPS Take 750 mg by mouth 2 (two) times daily.      Marland Kitchen levothyroxine (SYNTHROID, LEVOTHROID) 25 MCG tablet Take 25 mcg by mouth daily.      Marland Kitchen lisinopril (PRINIVIL,ZESTRIL) 2.5 MG tablet Take 2.5 mg by mouth daily.      . metFORMIN (GLUMETZA) 500 MG (MOD) 24 hr tablet Take 1,000 mg by mouth 2 (two) times daily with breakfast and lunch.      . Multiple Vitamin (MULTIVITAMIN  WITH MINERALS) TABS Take 1 tablet by mouth 2 (two) times daily.       . progesterone (PROMETRIUM) 100 MG capsule Take 100 mg by mouth at bedtime.      . sertraline (ZOLOFT) 100 MG tablet Take 50 mg by mouth daily.        No current facility-administered medications for this visit.    ROS: Pertinent items are noted in HPI.  Exam:    BP 118/70  Ht 5' 4.25" (1.632 m)  Wt 178 lb (80.74 kg)  BMI 30.31 kg/m2  LMP 11/12/2010   Wt Readings from Last 3 Encounters:  06/29/12 178 lb (80.74 kg)  05/01/08 176 lb 11.2 oz (80.151 kg)  03/22/08 175 lb 3.2 oz (79.47 kg)     Ht Readings from Last 3 Encounters:  06/29/12 5' 4.25" (1.632 m)  03/22/08 5' 4.5" (1.638 m)    General appearance: alert, cooperative and appears stated age Head: Normocephalic, without obvious abnormality, atraumatic Neck: no adenopathy, supple, symmetrical, trachea midline and thyroid not enlarged, symmetric, no tenderness/mass/nodules Lungs: clear to auscultation bilaterally Breasts: Inspection negative, No nipple retraction or  dimpling, No nipple discharge or bleeding, No axillary or supraclavicular adenopathy, Normal to palpation without dominant masses Heart: regular rate and rhythm Abdomen: soft, non-tender; bowel sounds normal; no masses,  no organomegaly Extremities: extremities normal, atraumatic, no cyanosis or edema Skin: Skin color, texture, turgor normal. No rashes or lesions Lymph nodes: Cervical, supraclavicular, and axillary nodes normal. No abnormal inguinal nodes palpated Neurologic: Grossly normal   Pelvic: External genitalia:  no lesions              Urethra:  normal appearing urethra with no masses, tenderness or lesions              Bartholins and Skenes: normal                 Vagina: normal appearing vagina with normal color and discharge, no lesions              Cervix: normal appearance              Pap taken: no        Bimanual Exam:  Uterus:  uterus is normal size, shape, consistency and nontender                                      Adnexa: normal adnexa in size, nontender and no masses                                      Rectovaginal: Confirms                                      Anus:  normal sphincter tone, no lesions  A: well woman     P: mammogram return annually or prn     An After Visit Summary was printed and given to the patient.

## 2012-10-06 ENCOUNTER — Other Ambulatory Visit: Payer: Self-pay | Admitting: Otolaryngology

## 2012-10-08 NOTE — Addendum Note (Signed)
Addended by: Kennidy Lamke on: 10/08/2012 11:33 AM   Modules accepted: Orders  

## 2012-10-24 ENCOUNTER — Encounter (HOSPITAL_COMMUNITY): Payer: Self-pay | Admitting: Pharmacy Technician

## 2012-10-24 NOTE — Pre-Procedure Instructions (Signed)
Xandrea Clarey  10/24/2012   Your procedure is scheduled on:  Wednesday, July 23rd.  Report to [facility] at 9:15AM.  Call this number if you have problems the morning of surgery: [phone]   Remember:   Do not eat food or drink liquids after midnight.   Take these medicines the morning of surgery with A SIP OF WATER: cetirizine (ZYRTEC),Dexlansoprazole, levothyroxine (SYNTHROID, LEVOTHROID, sertraline (ZOLOFT).  Stop taking Aspirin, Coumadin, Plavix, Effient and Herbal medications.  Don not take any NSAIDs ie: Ibuprofen,  Advil,Naproxen or any medication containing Aspirin.    Do not wear jewelry, make-up or nail polish.  Do not wear lotions, powders, or perfumes. You may wear deodorant.  Do not shave 48 hours prior to surgery.   Do not bring valuables to the hospital.  [facility] is not responsible for any belongings or valuables.  Contacts, dentures or bridgework may not be worn into surgery.  Leave suitcase in the car. After surgery it may be brought to your room.  For patients admitted to the hospital, checkout time is 11:00 AM the day of discharge.   Patients discharged the day of surgery will not be allowed to drive home.  Name and phone number of your driver: -   Special Instructions: Shower using CHG 2 nights before surgery and the night before surgery.  If you shower the day of surgery use CHG.  Use special wash - you have one bottle of CHG for all showers.  You should use approximately 1/3 of the bottle for each shower.   Please read over the following fact sheets that you were given: Pain Booklet, Coughing and Deep Breathing and Surgical Site Infection Prevention

## 2012-10-25 ENCOUNTER — Encounter (HOSPITAL_COMMUNITY)
Admission: RE | Admit: 2012-10-25 | Discharge: 2012-10-25 | Disposition: A | Discharge status: Home / Self Care | Payer: BC Managed Care – PPO | Source: Ambulatory Visit | Attending: Otolaryngology | Admitting: Otolaryngology

## 2012-10-25 ENCOUNTER — Encounter (HOSPITAL_COMMUNITY): Payer: Self-pay

## 2012-10-25 ENCOUNTER — Encounter (HOSPITAL_COMMUNITY)
Admission: RE | Admit: 2012-10-25 | Discharge: 2012-10-25 | Disposition: A | Discharge status: Home / Self Care | Payer: BC Managed Care – PPO | Source: Ambulatory Visit | Attending: Anesthesiology | Admitting: Anesthesiology

## 2012-10-25 DIAGNOSIS — Z01812 Encounter for preprocedural laboratory examination: Secondary | ICD-10-CM | POA: Insufficient documentation

## 2012-10-25 DIAGNOSIS — Z01811 Encounter for preprocedural respiratory examination: Secondary | ICD-10-CM | POA: Insufficient documentation

## 2012-10-25 DIAGNOSIS — Z01818 Encounter for other preprocedural examination: Secondary | ICD-10-CM | POA: Insufficient documentation

## 2012-10-25 DIAGNOSIS — Z01810 Encounter for preprocedural cardiovascular examination: Secondary | ICD-10-CM | POA: Insufficient documentation

## 2012-10-25 HISTORY — DX: Unspecified osteoarthritis, unspecified site: M19.90

## 2012-10-25 HISTORY — DX: Hypothyroidism, unspecified: E03.9

## 2012-10-25 HISTORY — DX: Nausea with vomiting, unspecified: R11.2

## 2012-10-25 HISTORY — DX: Gastro-esophageal reflux disease without esophagitis: K21.9

## 2012-10-25 HISTORY — DX: Other specified postprocedural states: Z98.890

## 2012-10-25 HISTORY — DX: Adverse effect of unspecified anesthetic, initial encounter: T41.45XA

## 2012-10-25 HISTORY — DX: Shortness of breath: R06.02

## 2012-10-25 HISTORY — DX: Sleep apnea, unspecified: G47.30

## 2012-10-25 HISTORY — DX: Cardiac arrhythmia, unspecified: I49.9

## 2012-10-25 HISTORY — DX: Unspecified asthma, uncomplicated: J45.909

## 2012-10-25 HISTORY — DX: Other complications of anesthesia, initial encounter: T88.59XA

## 2012-10-25 LAB — BASIC METABOLIC PANEL
CO2: 29 mEq/L (ref 19–32)
Calcium: 9.4 mg/dL (ref 8.4–10.5)
GFR calc non Af Amer: >90 mL/min (ref >90)
Sodium: 140 mEq/L (ref 135–145)

## 2012-10-25 LAB — CBC
MCV: 93 fL (ref 78.0–100.0)
Platelets: 180 10*3/uL (ref 150–400)
RBC: 4.15 MIL/uL (ref 3.87–5.11)
WBC: 3 10*3/uL — ABNORMAL LOW (ref 4.0–10.5)

## 2012-10-25 NOTE — Progress Notes (Signed)
I spoke with Marchelle Folks at Dr Clovis Pu office- she said "Anesthesia Consult  Is the one that is done routine day of surgery."

## 2012-11-01 MED ORDER — CEFAZOLIN SODIUM-DEXTROSE 2-3 GM-% IV SOLR
2.0000 g | INTRAVENOUS | Status: AC
  Administered 2012-11-02: 2 g via INTRAVENOUS
  Filled 2012-11-01: qty 50

## 2012-11-02 ENCOUNTER — Ambulatory Visit (HOSPITAL_COMMUNITY)
Admission: RE | Admit: 2012-11-02 | Discharge: 2012-11-03 | DRG: 055 | Disposition: A | Discharge status: Home / Self Care | Payer: BC Managed Care – PPO | Source: Ambulatory Visit | Attending: Otolaryngology | Admitting: Otolaryngology

## 2012-11-02 ENCOUNTER — Encounter (HOSPITAL_COMMUNITY): Payer: Self-pay | Admitting: *Deleted

## 2012-11-02 ENCOUNTER — Encounter (HOSPITAL_COMMUNITY)
Admission: RE | Disposition: A | Discharge status: Home / Self Care | Payer: Self-pay | Source: Ambulatory Visit | Attending: Otolaryngology

## 2012-11-02 ENCOUNTER — Ambulatory Visit (HOSPITAL_COMMUNITY): Payer: BC Managed Care – PPO | Admitting: Certified Registered Nurse Anesthetist

## 2012-11-02 ENCOUNTER — Encounter (HOSPITAL_COMMUNITY): Payer: Self-pay | Admitting: Certified Registered Nurse Anesthetist

## 2012-11-02 DIAGNOSIS — J343 Hypertrophy of nasal turbinates: Secondary | ICD-10-CM | POA: Insufficient documentation

## 2012-11-02 DIAGNOSIS — J988 Other specified respiratory disorders: Secondary | ICD-10-CM | POA: Insufficient documentation

## 2012-11-02 DIAGNOSIS — E119 Type 2 diabetes mellitus without complications: Secondary | ICD-10-CM | POA: Insufficient documentation

## 2012-11-02 DIAGNOSIS — J342 Deviated nasal septum: Secondary | ICD-10-CM | POA: Insufficient documentation

## 2012-11-02 DIAGNOSIS — Z79899 Other long term (current) drug therapy: Secondary | ICD-10-CM | POA: Insufficient documentation

## 2012-11-02 DIAGNOSIS — G4733 Obstructive sleep apnea (adult) (pediatric): Secondary | ICD-10-CM | POA: Insufficient documentation

## 2012-11-02 HISTORY — PX: NASAL SEPTOPLASTY W/ TURBINOPLASTY: SHX2070

## 2012-11-02 LAB — GLUCOSE, CAPILLARY
Glucose-Capillary: 122 mg/dL — ABNORMAL HIGH (ref 70–99)
Glucose-Capillary: 123 mg/dL — ABNORMAL HIGH (ref 70–99)
Glucose-Capillary: 233 mg/dL — ABNORMAL HIGH (ref 70–99)

## 2012-11-02 SURGERY — SEPTOPLASTY, NOSE, WITH NASAL TURBINATE REDUCTION
Anesthesia: General | Site: Nose | Laterality: Bilateral | Wound class: Clean Contaminated

## 2012-11-02 MED ORDER — HYDROCODONE-ACETAMINOPHEN 5-325 MG PO TABS
1.0000 | ORAL_TABLET | ORAL | Status: DC | PRN
  Administered 2012-11-02 (×2): 1 via ORAL
  Filled 2012-11-02 (×2): qty 1

## 2012-11-02 MED ORDER — ONDANSETRON HCL 4 MG/2ML IJ SOLN: 4.0000 mg | Freq: Four times a day (QID) | INTRAMUSCULAR | Status: DC | PRN

## 2012-11-02 MED ORDER — OXYCODONE HCL 5 MG PO TABS: 5.0000 mg | ORAL_TABLET | Freq: Once | ORAL | Status: DC | PRN

## 2012-11-02 MED ORDER — LIDOCAINE-EPINEPHRINE 1 %-1:100000 IJ SOLN
INTRAMUSCULAR | Status: AC
  Filled 2012-11-02: qty 1

## 2012-11-02 MED ORDER — DEXAMETHASONE SODIUM PHOSPHATE 10 MG/ML IJ SOLN: 10.0000 mg | Freq: Once | INTRAMUSCULAR | Status: DC

## 2012-11-02 MED ORDER — DEXAMETHASONE SODIUM PHOSPHATE 10 MG/ML IJ SOLN
INTRAMUSCULAR | Status: AC
  Administered 2012-11-02: 10 mg via INTRAVENOUS
  Administered 2012-11-02: 50 mg via INTRAVENOUS
  Filled 2012-11-02: qty 1

## 2012-11-02 MED ORDER — OXYMETAZOLINE HCL 0.05 % NA SOLN
NASAL | Status: AC
  Filled 2012-11-02: qty 15

## 2012-11-02 MED ORDER — LIDOCAINE HCL (CARDIAC) 20 MG/ML IV SOLN
INTRAVENOUS | Status: DC | PRN
  Administered 2012-11-02: 70 mg via INTRAVENOUS

## 2012-11-02 MED ORDER — ONDANSETRON HCL 4 MG/2ML IJ SOLN: 4.0000 mg | INTRAMUSCULAR | Status: DC | PRN

## 2012-11-02 MED ORDER — MIDAZOLAM HCL 5 MG/5ML IJ SOLN
INTRAMUSCULAR | Status: DC | PRN
  Administered 2012-11-02: 2 mg via INTRAVENOUS

## 2012-11-02 MED ORDER — LIDOCAINE-EPINEPHRINE 1 %-1:100000 IJ SOLN
INTRAMUSCULAR | Status: DC | PRN
  Administered 2012-11-02: 30 mL

## 2012-11-02 MED ORDER — OXYMETAZOLINE HCL 0.05 % NA SOLN
NASAL | Status: DC | PRN
  Administered 2012-11-02: 1 via NASAL

## 2012-11-02 MED ORDER — NEOSTIGMINE METHYLSULFATE 1 MG/ML IJ SOLN
INTRAMUSCULAR | Status: DC | PRN
  Administered 2012-11-02: 4 mg via INTRAVENOUS

## 2012-11-02 MED ORDER — ZOLPIDEM TARTRATE 5 MG PO TABS
5.0000 mg | ORAL_TABLET | Freq: Every evening | ORAL | Status: DC | PRN
  Administered 2012-11-03: 5 mg via ORAL
  Filled 2012-11-02 (×2): qty 1

## 2012-11-02 MED ORDER — HYDROMORPHONE HCL PF 1 MG/ML IJ SOLN
INTRAMUSCULAR | Status: AC
  Filled 2012-11-02: qty 1

## 2012-11-02 MED ORDER — LACTATED RINGERS IV SOLN
INTRAVENOUS | Status: DC
  Administered 2012-11-02: 09:00:00 via INTRAVENOUS

## 2012-11-02 MED ORDER — AMOXICILLIN-POT CLAVULANATE 500-125 MG PO TABS: 1.0000 | ORAL_TABLET | Freq: Two times a day (BID) | ORAL | Status: DC

## 2012-11-02 MED ORDER — ACETAMINOPHEN 650 MG RE SUPP: 650.0000 mg | RECTAL | Status: DC | PRN

## 2012-11-02 MED ORDER — ROCURONIUM BROMIDE 100 MG/10ML IV SOLN
INTRAVENOUS | Status: DC | PRN
  Administered 2012-11-02: 50 mg via INTRAVENOUS

## 2012-11-02 MED ORDER — SERTRALINE HCL 50 MG PO TABS
50.0000 mg | ORAL_TABLET | ORAL | Status: DC
  Administered 2012-11-03: 50 mg via ORAL
  Filled 2012-11-02: qty 1

## 2012-11-02 MED ORDER — INSULIN ASPART 100 UNIT/ML ~~LOC~~ SOLN
0.0000 [IU] | Freq: Three times a day (TID) | SUBCUTANEOUS | Status: DC
  Administered 2012-11-02: 3 [IU] via SUBCUTANEOUS
  Administered 2012-11-03: 2 [IU] via SUBCUTANEOUS

## 2012-11-02 MED ORDER — LACTATED RINGERS IV SOLN
INTRAVENOUS | Status: DC | PRN
  Administered 2012-11-02: 11:00:00 via INTRAVENOUS

## 2012-11-02 MED ORDER — ONDANSETRON HCL 4 MG/2ML IJ SOLN
INTRAMUSCULAR | Status: DC | PRN
  Administered 2012-11-02: 4 mg via INTRAVENOUS

## 2012-11-02 MED ORDER — FENTANYL CITRATE 0.05 MG/ML IJ SOLN
INTRAMUSCULAR | Status: DC | PRN
  Administered 2012-11-02 (×2): 50 ug via INTRAVENOUS
  Administered 2012-11-02: 100 ug via INTRAVENOUS

## 2012-11-02 MED ORDER — HYDROCODONE-ACETAMINOPHEN 5-325 MG PO TABS: 1.0000 | ORAL_TABLET | Freq: Four times a day (QID) | ORAL | Status: DC | PRN

## 2012-11-02 MED ORDER — PANTOPRAZOLE SODIUM 40 MG PO TBEC
40.0000 mg | DELAYED_RELEASE_TABLET | Freq: Every day | ORAL | Status: DC
  Administered 2012-11-03: 40 mg via ORAL
  Filled 2012-11-02: qty 1

## 2012-11-02 MED ORDER — GLYCOPYRROLATE 0.2 MG/ML IJ SOLN
INTRAMUSCULAR | Status: DC | PRN
  Administered 2012-11-02: 0.6 mg via INTRAVENOUS

## 2012-11-02 MED ORDER — MUPIROCIN CALCIUM 2 % EX CREA
TOPICAL_CREAM | CUTANEOUS | Status: AC
  Filled 2012-11-02: qty 15

## 2012-11-02 MED ORDER — MUPIROCIN CALCIUM 2 % EX CREA
TOPICAL_CREAM | CUTANEOUS | Status: DC | PRN
  Administered 2012-11-02: 1 via TOPICAL

## 2012-11-02 MED ORDER — CLONAZEPAM 0.5 MG PO TABS
0.5000 mg | ORAL_TABLET | Freq: Every day | ORAL | Status: DC
  Administered 2012-11-02: 0.5 mg via ORAL
  Filled 2012-11-02: qty 1

## 2012-11-02 MED ORDER — MORPHINE SULFATE 2 MG/ML IJ SOLN: 2.0000 mg | INTRAMUSCULAR | Status: DC | PRN

## 2012-11-02 MED ORDER — ONDANSETRON HCL 4 MG PO TABS: 4.0000 mg | ORAL_TABLET | ORAL | Status: DC | PRN

## 2012-11-02 MED ORDER — PROPOFOL 10 MG/ML IV BOLUS
INTRAVENOUS | Status: DC | PRN
  Administered 2012-11-02: 130 mg via INTRAVENOUS

## 2012-11-02 MED ORDER — 0.9 % SODIUM CHLORIDE (POUR BTL) OPTIME
TOPICAL | Status: DC | PRN
  Administered 2012-11-02: 1000 mL

## 2012-11-02 MED ORDER — OXYCODONE HCL 5 MG/5ML PO SOLN: 5.0000 mg | Freq: Once | ORAL | Status: DC | PRN

## 2012-11-02 MED ORDER — HYDROMORPHONE HCL PF 1 MG/ML IJ SOLN
0.2500 mg | INTRAMUSCULAR | Status: DC | PRN
  Administered 2012-11-02 (×2): 0.5 mg via INTRAVENOUS

## 2012-11-02 MED ORDER — ALPRAZOLAM 0.25 MG PO TABS: 1.0000 mg | ORAL_TABLET | Freq: Three times a day (TID) | ORAL | Status: DC | PRN

## 2012-11-02 MED ORDER — LISINOPRIL 2.5 MG PO TABS
2.5000 mg | ORAL_TABLET | Freq: Every day | ORAL | Status: DC
  Administered 2012-11-02 – 2012-11-03 (×2): 2.5 mg via ORAL
  Filled 2012-11-02 (×2): qty 1

## 2012-11-02 MED ORDER — METFORMIN HCL ER 500 MG PO TB24
500.0000 mg | ORAL_TABLET | Freq: Every day | ORAL | Status: DC
  Filled 2012-11-02 (×2): qty 1

## 2012-11-02 MED ORDER — DEXTROSE IN LACTATED RINGERS 5 % IV SOLN
INTRAVENOUS | Status: DC
  Administered 2012-11-02: 1000 mL via INTRAVENOUS

## 2012-11-02 MED ORDER — LEVOTHYROXINE SODIUM 25 MCG PO TABS
25.0000 ug | ORAL_TABLET | Freq: Every day | ORAL | Status: DC
  Administered 2012-11-02: 25 ug via ORAL
  Filled 2012-11-02 (×3): qty 1

## 2012-11-02 MED ORDER — ACETAMINOPHEN 160 MG/5ML PO SOLN: 650.0000 mg | ORAL | Status: DC | PRN

## 2012-11-02 SURGICAL SUPPLY — 25 items
CANISTER SUCTION 2500CC (MISCELLANEOUS) ×2 IMPLANT
CLOTH BEACON ORANGE TIMEOUT ST (SAFETY) ×2 IMPLANT
COAGULATOR SUCT SWTCH 10FR 6 (ELECTROSURGICAL) IMPLANT
ELECT REM PT RETURN 9FT ADLT (ELECTROSURGICAL)
ELECTRODE REM PT RTRN 9FT ADLT (ELECTROSURGICAL) IMPLANT
GAUZE SPONGE 2X2 8PLY STRL LF (GAUZE/BANDAGES/DRESSINGS) ×1 IMPLANT
GLOVE BIOGEL M 7.0 STRL (GLOVE) ×4 IMPLANT
GLOVE BIOGEL PI IND STRL 7.0 (GLOVE) ×2 IMPLANT
GLOVE BIOGEL PI INDICATOR 7.0 (GLOVE) ×2
GOWN STRL NON-REIN LRG LVL3 (GOWN DISPOSABLE) ×6 IMPLANT
KIT BASIN OR (CUSTOM PROCEDURE TRAY) ×2 IMPLANT
KIT ROOM TURNOVER OR (KITS) ×2 IMPLANT
NS IRRIG 1000ML POUR BTL (IV SOLUTION) ×2 IMPLANT
PAD ARMBOARD 7.5X6 YLW CONV (MISCELLANEOUS) ×2 IMPLANT
SPLINT NASAL DOYLE BI-VL (GAUZE/BANDAGES/DRESSINGS) ×2 IMPLANT
SPONGE GAUZE 2X2 STER 10/PKG (GAUZE/BANDAGES/DRESSINGS) ×1
SPONGE NEURO XRAY DETECT 1X3 (DISPOSABLE) ×2 IMPLANT
SUT ETHILON 3 0 PS 1 (SUTURE) ×2 IMPLANT
SUT PLAIN 4 0 ~~LOC~~ 1 (SUTURE) ×2 IMPLANT
TOWEL OR 17X24 6PK STRL BLUE (TOWEL DISPOSABLE) ×2 IMPLANT
TOWEL OR 17X26 10 PK STRL BLUE (TOWEL DISPOSABLE) ×2 IMPLANT
TRAY ENT MC OR (CUSTOM PROCEDURE TRAY) ×2 IMPLANT
TUBE SALEM SUMP 16 FR W/ARV (TUBING) ×2 IMPLANT
TUBING EXTENTION W/L.L. (IV SETS) ×2 IMPLANT
WATER STERILE IRR 1000ML POUR (IV SOLUTION) IMPLANT

## 2012-11-02 NOTE — Preoperative (Signed)
Beta Blockers   Reason not to administer Beta Blockers:Not Applicable 

## 2012-11-02 NOTE — Anesthesia Preprocedure Evaluation (Addendum)
Anesthesia Evaluation  Patient identified by MRN, date of birth, ID band Patient awake    Reviewed: Allergy & Precautions, H&P , NPO status , Patient's Chart, lab work & pertinent test results  History of Anesthesia Complications (+) PONV  Airway Mallampati: II  Neck ROM: full    Dental   Pulmonary shortness of breath, asthma , sleep apnea ,          Cardiovascular + Valvular Problems/Murmurs MVP     Neuro/Psych negative neurological ROS  negative psych ROS   GI/Hepatic Neg liver ROS, GERD-  Medicated and Controlled,  Endo/Other  diabetes, Type 2Hypothyroidism   Renal/GU negative Renal ROS     Musculoskeletal  (+) Arthritis -,   Abdominal   Peds  Hematology   Anesthesia Other Findings   Reproductive/Obstetrics                          Anesthesia Physical Anesthesia Plan  ASA: II  Anesthesia Plan: General   Post-op Pain Management:    Induction: Intravenous  Airway Management Planned: Oral ETT  Additional Equipment:   Intra-op Plan:   Post-operative Plan: Extubation in OR  Informed Consent: I have reviewed the patients History and Physical, chart, labs and discussed the procedure including the risks, benefits and alternatives for the proposed anesthesia with the patient or authorized representative who has indicated his/her understanding and acceptance.     Plan Discussed with: CRNA, Anesthesiologist and Surgeon  Anesthesia Plan Comments:         Anesthesia Quick Evaluation

## 2012-11-02 NOTE — Transfer of Care (Signed)
Immediate Anesthesia Transfer of Care Note  Patient: Alexandria Parker  Procedure(s) Performed: Procedure(s): NASAL SEPTOPLASTY WITH BILATERAL INFERIOR TURBINATE REDUCTION (Bilateral)  Patient Location: PACU  Anesthesia Type:General  Level of Consciousness: awake, alert  and oriented  Airway & Oxygen Therapy: Patient Spontanous Breathing and Patient connected to face mask oxygen  Post-op Assessment: Report given to PACU RN and Post -op Vital signs reviewed and stable  Post vital signs: Reviewed and stable  Complications: No apparent anesthesia complications

## 2012-11-02 NOTE — Op Note (Signed)
NAMELARNA, Alexandria Parker                ACCOUNT NO.:  [id number]  MEDICAL RECORD NO.:  [id number]  LOCATION:  [facility]                         FACILITY:  [facility]  PHYSICIAN:  Kinnie Scales. Annalee Genta, M.D.DATE OF BIRTH:  09-21-1955  DATE OF PROCEDURE:  11/02/2012 DATE OF DISCHARGE:                              OPERATIVE REPORT   PREOPERATIVE DIAGNOSES: 1. Deviated nasal septum with airway obstruction. 2. Bilateral inferior turbinate hypertrophy. 3. Obstructive sleep apnea.  POSTOPERATIVE DIAGNOSES: 1. Deviated nasal septum with airway obstruction. 2. Bilateral inferior turbinate hypertrophy. 3. Obstructive sleep apnea.  INDICATION FOR SURGERY: 1. Deviated nasal septum with airway obstruction. 2. Bilateral inferior turbinate hypertrophy. 3. Obstructive sleep apnea.  SURGICAL PROCEDURE: 1. Nasal septoplasty. 2. Bilateral inferior turbinate reduction.  ANESTHESIA:  General endotracheal.  COMPLICATIONS:  None.  ESTIMATED BLOOD LOSS:  Less than 50 mL.  The patient transferred from the operating room to the recovery room in stable condition.  BRIEF HISTORY:  The patient is a 57 year old white female, referred to our office with a history of progressive nasal airway obstruction and moderate obstructive sleep apnea.  She had undergone inpatient sleep study at the request of her primary care physician, Dr. Arlyce Dice and was found to have moderate level obstructive sleep apnea with an AHI of 25.9, and an O2 nadir to 87%.  The patient has noted progressive symptoms of nasal congestion, nasal airway obstruction and snoring.  She had been previously treated with topical nasal steroids, antihistamines, and allergy medications with limited improvement in symptoms. Examination in the office revealed significant nasal septal deviation with near complete right-sided nasal airway obstruction and bilateral inferior turbinate hypertrophy.  Given her history and findings, I recommended nasal septoplasty and  inferior turbinate reduction to improve nasal patency and airflow and improve compliance with CPAP over the long term.  The risks and benefits of the procedure were discussed in detail with the patient who understood and concurred with our plan for surgery which is scheduled as an outpatient under general anesthesia at [facility] with expected overnight observation.  PROCEDURE:  The patient was brought to the operating room on November 02, 2012, placed in supine position on the operating table.  General endotracheal anesthesia was established without difficulty.  The patient was adequately anesthetized.  She was positioned on the operating table and prepped and draped in a sterile fashion.  Her nose was then injected with a total of 8 mL of 1% lidocaine with 1:100,000 solution epinephrine which was injected in the submucosal fashion along the nasal septum and inferior turbinates bilaterally.  Bilateral Afrin soaked cottonoid pledgets were placed in each nasal passageway and left in place for approximately 10 minutes for vasoconstriction and hemostasis.  The patient was then positioned and prepared for surgery.  Surgical procedure was begun with a left anterior hemitransfixion incision which was carried through the mucosa and underlying submucosa and a mucoperichondrial flap was elevated on the left-hand side.  The anterior cartilaginous septum was across at the midline and the mucoperichondrial flap was elevated on the right.  Deviated bone and cartilage in the mid and posterior aspects of the nasal septum were then mobilized and removed.  Mid septal cartilage  was removed and later morselized and returned to the mucoperichondrial pocket.  There was a large inferior bony septal spur which was mobilized and resected.  This brought the septum to a good midline position.  The morselized cartilage was then replaced and the mucoperichondrial flaps were reapproximated with a 4-0 gut  suture on a Keith needle in a horizontal mattress fashion.  The anterior hemitransfixion incision was closed with the same stitch. Bilateral Doyle nasal septal splints were then placed after the application of Bactroban ointment and sutured in position with a 3-0 Ethilon suture.  With the septum in a good midline position, bilateral inferior turbinate reduction was performed with bipolar cautery set at 12 watts.  Two submucosal passes were made in each inferior turbinate.  When the turbinate has been adequately cauterized, they were outfractured.  An anterior incision was created in each turbinate, overlying soft tissue was elevated, and a small amount of turbinate bone was resected.  The nasal cavity was widely patent.  There was minimal bleeding.  Nasal cavity was suctioned.  Orogastric tube was passed.  Stomach contents were aspirated.  The patient was then awakened from anesthetic.  She was extubated and transferred from the operating room to the recovery room in stable condition.  There were no complications and blood loss was less than 50 mL.          ______________________________ Kinnie Scales. Annalee Genta, M.D.     DLS/MEDQ  D:  16/01/9603  T:  11/02/2012  Job:  [id number]

## 2012-11-02 NOTE — H&P (Signed)
Alexandria Parker is an 57 y.o. female.   Chief Complaint: Nasal Obstruction HPI: Prog nasal obstruction and OSA, hx of nasal trauma  Past Medical History  Diagnosis Date  . Diabetes mellitus   . Diabetes   . MVP (mitral valve prolapse)   . Plantar fasciitis   . Hypercholesteremia   . Thyroid disease   . Complication of anesthesia   . PONV (postoperative nausea and vomiting)     2004- with back surgery  . Hypothyroidism   . Dysrhythmia     skip a beat  . Shortness of breath     with exertion  . Asthma     hx of   . Sleep apnea   . GERD (gastroesophageal reflux disease)   . Arthritis     Past Surgical History  Procedure Laterality Date  . Laparoscopic salpingo oopherectomy Right 04/14/1999  . Rotator cuff repair Bilateral 1999/2001  . Cesarean section    . Appendectomy    . Back surgery  06/2002    L5 HNP  . Cyst removal leg Left     thigh,Lt wrist  . Ganglion cyst excision Left     wrist  . Toe surgery      Family History  Problem Relation Age of Onset  . Diabetes Mother   . Hyperlipidemia Father    Social History:  reports that she has never smoked. She does not have any smokeless tobacco history on file. She reports that she drinks about 2.4 ounces of alcohol per week. She reports that she does not use illicit drugs.  Allergies:  Allergies  Allergen Reactions  . Benadryl (Diphenhydramine Hcl)     Increase heart rate  . Codeine Itching  . Erythromycin Nausea And Vomiting  . Nsaids     GI Distress happened years ago when she was taking a lot of it post back surgery     Medications Prior to Admission  Medication Sig Dispense Refill  . Calcium-Magnesium-Vitamin D (CALCIUM MAGNESIUM PO) Take 1 tablet by mouth 2 (two) times daily.      . cetirizine (ZYRTEC) 10 MG tablet Take 10 mg by mouth daily.      . Cholecalciferol (VITAMIN D3) 2000 UNITS TABS Take 2,000 Units by mouth daily.      . clonazePAM (KLONOPIN) 0.5 MG tablet Take 0.5 mg by mouth at bedtime.       Marland Kitchen  Dexlansoprazole 30 MG capsule Take 30 mg by mouth daily.      . fish oil-omega-3 fatty acids 1000 MG capsule Take 1 g by mouth daily.      . Glucosamine Sulfate 750 MG CAPS Take 750 mg by mouth 2 (two) times daily.      Marland Kitchen levothyroxine (SYNTHROID, LEVOTHROID) 25 MCG tablet Take 25 mcg by mouth daily.      Marland Kitchen lisinopril (PRINIVIL,ZESTRIL) 2.5 MG tablet Take 2.5 mg by mouth daily.      . metFORMIN (GLUMETZA) 500 MG (MOD) 24 hr tablet Take 1,000 mg by mouth daily.       . Multiple Vitamin (MULTIVITAMIN WITH MINERALS) TABS Take 1 tablet by mouth 2 (two) times daily.       . sertraline (ZOLOFT) 100 MG tablet Take 50 mg by mouth every other day.       . ALPRAZolam (XANAX) 1 MG tablet Take 1 mg by mouth 3 (three) times daily as needed for sleep or anxiety.         Results for orders placed during the hospital  encounter of 11/02/12 (from the past 48 hour(s))  GLUCOSE, CAPILLARY     Status: Abnormal   Collection Time    11/02/12  9:21 AM      Result Value Range   Glucose-Capillary 122 (*) 70 - 99 mg/dL   No results found.  Review of Systems  Constitutional: Negative.   HENT: Negative.   Respiratory: Negative.   Cardiovascular: Negative.   Gastrointestinal: Negative.   Musculoskeletal: Negative.     Blood pressure 118/74, pulse 76, temperature 98.1 F (36.7 C), temperature source Oral, resp. rate 20, last menstrual period 11/12/2010, SpO2 98.00%. Physical Exam  Constitutional: She is oriented to person, place, and time. She appears well-developed and well-nourished.  HENT:  Nose: Septal deviation present.  Neck: Normal range of motion. Neck supple.  Cardiovascular: Normal rate.   Respiratory: Effort normal and breath sounds normal.  GI: Soft.  Musculoskeletal: Normal range of motion.  Neurological: She is alert and oriented to person, place, and time.     Assessment/Plan Adm for nasal surgery as OP under GA  Celina Shiley 11/02/2012, 11:31 AM

## 2012-11-02 NOTE — Progress Notes (Signed)
Day of Surgery  Subjective: Doing well. No bleeding. Taking po well.   Objective: Vital signs in last 24 hours: Temp:  [97.3 F (36.3 C)-98.3 F (36.8 C)] 97.3 F (36.3 C) (07/23 1505) Pulse Rate:  [64-108] 75 (07/23 1505) Resp:  [8-22] 18 (07/23 1505) BP: (115-152)/(66-86) 133/75 mmHg (07/23 1505) SpO2:  [89 %-100 %] 100 % (07/23 1505) Weight:  [78.5 kg (173 lb 1 oz)] 78.5 kg (173 lb 1 oz) (07/23 1505)    Intake/Output from previous day:   Intake/Output this shift: Total I/O In: 850 [I.V.:850] Out: 40 [Blood:40]  awake and alert packing in place. no bleeding/ breathing well. no stridor.   Lab Results:  No results found for this basename: WBC, HGB, HCT, PLT,  in the last 72 hours BMET No results found for this basename: NA, K, CL, CO2, GLUCOSE, BUN, CREATININE, CALCIUM,  in the last 72 hours PT/INR No results found for this basename: LABPROT, INR,  in the last 72 hours ABG No results found for this basename: PHART, PCO2, PO2, HCO3,  in the last 72 hours  Studies/Results: No results found.  Anti-infectives: Anti-infectives   Start     Dose/Rate Route Frequency Ordered Stop   11/02/12 0600  ceFAZolin (ANCEF) IVPB 2 g/50 mL premix     2 g 100 mL/hr over 30 Minutes Intravenous On call to O.R. 11/01/12 1415 11/02/12 1142   11/02/12 0000  amoxicillin-clavulanate (AUGMENTIN) 500-125 MG per tablet     1 tablet Oral 2 times daily 11/02/12 1253        Assessment/Plan: s/p Procedure(s): NASAL SEPTOPLASTY WITH BILATERAL INFERIOR TURBINATE REDUCTION (Bilateral) continue care  LOS: 0 days    Suzanna Obey 11/02/2012

## 2012-11-02 NOTE — Anesthesia Postprocedure Evaluation (Signed)
Anesthesia Post Note  Patient: Pharmacist, hospital  Procedure(s) Performed: Procedure(s) (LRB): NASAL SEPTOPLASTY WITH BILATERAL INFERIOR TURBINATE REDUCTION (Bilateral)  Anesthesia type: General  Patient location: PACU  Post pain: Pain level controlled and Adequate analgesia  Post assessment: Post-op Vital signs reviewed, Patient's Cardiovascular Status Stable, Respiratory Function Stable, Patent Airway and Pain level controlled  Last Vitals:  Filed Vitals:   11/02/12 1315  BP: 134/77  Pulse:   Temp:   Resp: 14    Post vital signs: Reviewed and stable  Level of consciousness: awake, alert  and oriented  Complications: No apparent anesthesia complications

## 2012-11-02 NOTE — Brief Op Note (Signed)
11/02/2012  12:42 PM  PATIENT:  Alexandria Parker  57 y.o. female  PRE-OPERATIVE DIAGNOSIS:  DEVIATED NASAL SEPTUM OBSTRUCTION/ SLEEP APNEA   POST-OPERATIVE DIAGNOSIS:  DEVIATED NASAL SEPTUM OBSTRUCTION/ SLEEP APNEA   PROCEDURE:  Procedure(s): NASAL SEPTOPLASTY WITH BILATERAL INFERIOR TURBINATE REDUCTION (Bilateral)  SURGEON:  Surgeon(s) and Role:    * Osborn Coho, MD - Primary  PHYSICIAN ASSISTANT:   ASSISTANTS: none   ANESTHESIA:   general  EBL:    <50cc  BLOOD ADMINISTERED:none  DRAINS: none   LOCAL MEDICATIONS USED:  LIDOCAINE  and Amount: 8 ml  SPECIMEN:  No Specimen  DISPOSITION OF SPECIMEN:  N/A  COUNTS:  YES  TOURNIQUET:  * No tourniquets in log *  DICTATION: .Other Dictation: Dictation Number [id number]  PLAN OF CARE: Admit for overnight observation  PATIENT DISPOSITION:  PACU - hemodynamically stable.   Delay start of Pharmacological VTE agent (>24hrs) due to surgical blood loss or risk of bleeding: no

## 2012-11-03 ENCOUNTER — Encounter (HOSPITAL_COMMUNITY): Payer: Self-pay | Admitting: Otolaryngology

## 2012-11-03 DIAGNOSIS — J342 Deviated nasal septum: Secondary | ICD-10-CM

## 2012-11-03 LAB — GLUCOSE, CAPILLARY: Glucose-Capillary: 155 mg/dL — ABNORMAL HIGH (ref 70–99)

## 2012-11-03 MED ORDER — LEVOTHYROXINE SODIUM 25 MCG PO TABS
25.0000 ug | ORAL_TABLET | Freq: Every day | ORAL | Status: DC
  Filled 2012-11-03: qty 1

## 2012-11-03 MED ORDER — METFORMIN HCL ER 500 MG PO TB24: 500.0000 mg | ORAL_TABLET | Freq: Every day | ORAL | Status: DC

## 2012-11-03 NOTE — Discharge Summary (Signed)
Physician Discharge Summary  Patient ID: Alexandria Parker MRN: [id number] DOB/AGE: Oct 20, 1955 57 y.o.  Admit date: 11/02/2012 Discharge date: 11/03/2012  Admission Diagnoses:  Principal Problem:   Deviated nasal septum   Discharge Diagnoses:  Same  Surgeries: Procedure(s): NASAL SEPTOPLASTY WITH BILATERAL INFERIOR TURBINATE REDUCTION on 11/02/2012   Consultants: none  Discharged Condition: Improved  Hospital Course: Alexandria Parker is an 57 y.o. female who was admitted 11/02/2012 with a diagnosis of Deviated nasal septum and went to the operating room on 11/02/2012 and underwent the above named procedures.   Pt stable, d/c to home am postop.  Recent vital signs:  Filed Vitals:   11/03/12 0837  BP: 108/74  Pulse: 64  Temp: 97.4 F (36.3 C)  Resp: 15    Recent laboratory studies:  Results for orders placed during the hospital encounter of 11/02/12  GLUCOSE, CAPILLARY      Result Value Range   Glucose-Capillary 122 (*) 70 - 99 mg/dL  GLUCOSE, CAPILLARY      Result Value Range   Glucose-Capillary 123 (*) 70 - 99 mg/dL   Comment 1 Documented in Chart     Comment 2 Notify RN    GLUCOSE, CAPILLARY      Result Value Range   Glucose-Capillary 188 (*) 70 - 99 mg/dL   Comment 1 Notify RN    GLUCOSE, CAPILLARY      Result Value Range   Glucose-Capillary 246 (*) 70 - 99 mg/dL   Comment 1 Notify RN    GLUCOSE, CAPILLARY      Result Value Range   Glucose-Capillary 233 (*) 70 - 99 mg/dL  GLUCOSE, CAPILLARY      Result Value Range   Glucose-Capillary 155 (*) 70 - 99 mg/dL   Comment 1 Notify RN      Discharge Medications:     Medication List         ALPRAZolam 1 MG tablet  Commonly known as:  XANAX  Take 1 mg by mouth 3 (three) times daily as needed for sleep or anxiety.     amoxicillin-clavulanate 500-125 MG per tablet  Commonly known as:  AUGMENTIN  Take 1 tablet (500 mg total) by mouth 2 (two) times daily.     CALCIUM MAGNESIUM PO  Take 1 tablet by mouth 2 (two) times  daily.     cetirizine 10 MG tablet  Commonly known as:  ZYRTEC  Take 10 mg by mouth daily.     clonazePAM 0.5 MG tablet  Commonly known as:  KLONOPIN  Take 0.5 mg by mouth at bedtime.     Dexlansoprazole 30 MG capsule  Take 30 mg by mouth daily.     fish oil-omega-3 fatty acids 1000 MG capsule  Take 1 g by mouth daily.     Glucosamine Sulfate 750 MG Caps  Take 750 mg by mouth 2 (two) times daily.     HYDROcodone-acetaminophen 5-325 MG per tablet  Commonly known as:  NORCO  Take 1-2 tablets by mouth every 6 (six) hours as needed for pain.     levothyroxine 25 MCG tablet  Commonly known as:  SYNTHROID, LEVOTHROID  Take 25 mcg by mouth daily.     lisinopril 2.5 MG tablet  Commonly known as:  PRINIVIL,ZESTRIL  Take 2.5 mg by mouth daily.     metFORMIN 500 MG (MOD) 24 hr tablet  Commonly known as:  GLUMETZA  Take 1,000 mg by mouth daily.     multivitamin with minerals Tabs  Take 1 tablet  by mouth 2 (two) times daily.     sertraline 100 MG tablet  Commonly known as:  ZOLOFT  Take 50 mg by mouth every other day.     Vitamin D3 2000 UNITS Tabs  Take 2,000 Units by mouth daily.        Diagnostic Studies: Dg Chest 2 View  10/25/2012   *RADIOLOGY REPORT*  Clinical Data: History of hypertension and diabetes.  Septoplasty.  CHEST - 2 VIEW  Comparison: Chest x-ray of 04/17/2009.  Findings: Lung volumes are normal.  No consolidative airspace disease.  No pleural effusions.  No pneumothorax.  No pulmonary nodule or mass noted.  Pulmonary vasculature and the cardiomediastinal silhouette are within normal limits.  IMPRESSION: 1. No radiographic evidence of acute cardiopulmonary disease.   Original Report Authenticated By: Trudie Reed, M.D.    Disposition:       Discharge Orders   Future Appointments Provider Department Dept Phone   07/05/2013 11:15 AM Melony Overly, MD [facility] [phone]   Future Orders Complete By Expires     Diet - low sodium  heart healthy  As directed     Discharge instructions  As directed     Comments:      1. Limited activity 2. Liquid and soft diet 3. May bathe and shower 4. Saline nasal spray - 4 puffs/nostril every hour while awake 5. Elevate Head of Bed 6. No nose blowing    Increase activity slowly  As directed        Follow-up Information   Follow up with Slevin Gunby, MD In 2 weeks.   Contact information:   [address] [place] [address] [phone]        Signed: Annalee Genta, Tiffannie Sloss 11/03/2012, 10:08 AM

## 2012-11-03 NOTE — Progress Notes (Signed)
Patient is discharged to home in stable condition.  Husband at bedside.  Denies of  Pain.  Nasal pad changed prior to discharged.

## 2012-12-16 ENCOUNTER — Other Ambulatory Visit: Payer: Self-pay | Admitting: Endocrinology

## 2012-12-16 DIAGNOSIS — E049 Nontoxic goiter, unspecified: Secondary | ICD-10-CM

## 2012-12-20 ENCOUNTER — Ambulatory Visit
Admission: RE | Admit: 2012-12-20 | Discharge: 2012-12-20 | Disposition: A | Discharge status: Home / Self Care | Payer: BC Managed Care – PPO | Source: Ambulatory Visit | Attending: Endocrinology | Admitting: Endocrinology

## 2012-12-20 DIAGNOSIS — E049 Nontoxic goiter, unspecified: Secondary | ICD-10-CM

## 2013-02-16 ENCOUNTER — Other Ambulatory Visit: Payer: Self-pay

## 2013-02-22 ENCOUNTER — Ambulatory Visit (HOSPITAL_BASED_OUTPATIENT_CLINIC_OR_DEPARTMENT_OTHER): Payer: BC Managed Care – PPO | Attending: Otolaryngology

## 2013-02-22 DIAGNOSIS — G4733 Obstructive sleep apnea (adult) (pediatric): Secondary | ICD-10-CM

## 2013-03-04 DIAGNOSIS — G4733 Obstructive sleep apnea (adult) (pediatric): Secondary | ICD-10-CM

## 2013-03-05 NOTE — Procedures (Signed)
NAMEJASMIA, Alexandria Parker                ACCOUNT NO.:  [id number]  MEDICAL RECORD NO.:  [id number]          PATIENT TYPE:  OUT  LOCATION:  SLEEP CENTER                 FACILITY:  [facility]  PHYSICIAN:  Nadiya Pieratt D. Maple Hudson, MD, FCCP, FACPDATE OF BIRTH:  Aug 24, 1955  DATE OF STUDY:  02/22/2013                           NOCTURNAL POLYSOMNOGRAM  REFERRING PHYSICIAN:  Onalee Hua L. Annalee Genta, M.D.  INDICATION FOR STUDY:  Hypersomnia with sleep apnea.  EPWORTH SLEEPINESS SCORE:  13/24.  BMI 29.1, weight 175 pounds, height 65 inches, neck 15 inches.  MEDICATIONS:  Home medications are charted for review.  SLEEP ARCHITECTURE:  Total sleep time 252.5 minutes with sleep efficiency 66.7%.  Stage I was 11.5%, stage II 76.4%, stage III absent, REM 12.1% of total sleep time.  Sleep latency 65 minutes.  REM latency 243.5 minutes.  Awake after sleep onset 52 minutes.  Arousal index 14.3.  BEDTIME MEDICATION:  Clonazepam 0.5 mg.  RESPIRATORY DATA:  An apnea hypotony index (AHI) 7.1 per hour.  A total of 30 events were scored, all as hypopneas associated with supine sleep position and REM.  REM AHI 23.6 per hour.  There were not enough events for split protocol CPAP titration.  OXYGEN DATA:  Moderately loud snoring with oxygen desaturation to a nadir of 80% and mean oxygen saturation through the study of 93.2% on room air.  CARDIAC DATA:  Sinus rhythm with occasional PAC and PVC.  MOVEMENT-PARASOMNIA:  A total of 21 limb jerks were counted of which 9 were associated with arousal, awakening, or periodic limb movements with arousal index of 2.1 per hour.  Bathroom x2.  IMPRESSIONS-RECOMMENDATIONS: 1. Mild obstructive sleep apnea/hypopnea syndrome, AHI of 7.1 per     hour.  Events were hypopneas associated with supine sleep position     and REM.  REM AHI 23.6 per hour.  Moderately loud snoring with     oxygen desaturation to a nadir of 80% and mean oxygen saturation     through the study of 93.2% on room  air. 2. There were not enough early events on this study to permit     application of split protocol CPAP titration.  Conservative     therapies such as encouragement to sleep flat of back, treatment     for any significant nasal obstruction, and weight loss might be appropriate     on an individual basis.  Therapeutic benefit of CPAP or oral appliance         could be considered.     Eeshan Verbrugge D. Maple Hudson, MD, [facility], FACP Diplomate, American Board of Sleep Medicine    CDY/MEDQ  D:  03/04/2013 10:42:42  T:  03/05/2013 82:95:62  Job:  [id number]

## 2013-07-05 ENCOUNTER — Ambulatory Visit (INDEPENDENT_AMBULATORY_CARE_PROVIDER_SITE_OTHER): Payer: BC Managed Care – PPO | Admitting: Obstetrics and Gynecology

## 2013-07-05 ENCOUNTER — Ambulatory Visit: Payer: PRIVATE HEALTH INSURANCE | Admitting: Obstetrics and Gynecology

## 2013-07-05 ENCOUNTER — Encounter: Payer: Self-pay | Admitting: Obstetrics and Gynecology

## 2013-07-05 ENCOUNTER — Telehealth: Payer: Self-pay | Admitting: Obstetrics and Gynecology

## 2013-07-05 VITALS — BP 122/74 | HR 68 | Ht 64.5 in | Wt 169.0 lb

## 2013-07-05 DIAGNOSIS — K921 Melena: Secondary | ICD-10-CM

## 2013-07-05 DIAGNOSIS — N952 Postmenopausal atrophic vaginitis: Secondary | ICD-10-CM

## 2013-07-05 DIAGNOSIS — Z01419 Encounter for gynecological examination (general) (routine) without abnormal findings: Secondary | ICD-10-CM

## 2013-07-05 DIAGNOSIS — Z0000 Encounter for general adult medical examination without abnormal findings: Secondary | ICD-10-CM

## 2013-07-05 LAB — POCT URINALYSIS DIPSTICK
Bilirubin, UA: NEGATIVE
Glucose, UA: NEGATIVE
Ketones, UA: NEGATIVE
Leukocytes, UA: NEGATIVE
NITRITE UA: NEGATIVE
PH UA: 5
PROTEIN UA: NEGATIVE
RBC UA: NEGATIVE
UROBILINOGEN UA: NEGATIVE

## 2013-07-05 NOTE — Progress Notes (Signed)
Patient ID: Alexandria Parker, female   DOB: 29-Aug-1955, 58 y.o.   MRN: [id number] GYNECOLOGY VISIT  PCP:  Dr. Arlyce Dice  Referring provider:   none  HPI: 58 y.o.   Married  Caucasian  female   G3P2 with Patient's last menstrual period was 11/12/2010.   here for annual exam. Having a back issue and not sure what is the cause.  History of prior back surgery.  Having pain in her left side also. Feels bloated. Bowel movements changed. Having some constipation.  Had blood in the stool recently.  Has fecal incontinence.    Having an odor vaginally.  Does not feel like a yeast infection.  Not currently on HRT. Took Vivelle and progesterone for about one year and stopped.  Not using vaginal estrogen cream. Decreased sexual activity.  No pain.   Retired from [employer].  Hgb:  PCP  Urine:  PH 5, negative.  GYNECOLOGIC HISTORY: Patient's last menstrual period was 11/12/2010. Sexually active:  yes Partner preference: female Contraception: postmenopausal Menopausal hormone therapy:  None now DES exposure:   none Blood transfusions:   none Sexually transmitted diseases:  none    GYN procedures and prior surgeries:  Right oophorectomy, C-section Last mammogram:  06/21/2013 - WNL, [facility].  Last pap and high risk HPV testing:   05/12/11, WNL, neg HR HPV History of abnormal pap smear:  none   OB History   Grav Para Term Preterm Abortions TAB SAB Ect Mult Living   3 2        2        LIFESTYLE: Exercise:  Walking 4 times per week depending on weather Tobacco: none Alcohol: 1 bottle of wine per month Drug use:  none  OTHER HEALTH MAINTENANCE: Tetanus/TDap:  2007 Gardisil:  n/a Influenza:  none Zostavax:  none  Bone density:  2009 Colonoscopy:  06/2008, repeat in 5 years.  History of polyp.  Cholesterol check: 06/14/13  Family History  Problem Relation Age of Onset  . Diabetes Mother   . Hyperlipidemia Father     Patient Active Problem List   Diagnosis Date Noted  .  Deviated nasal septum 11/03/2012  . Endometriosis of ovary, s/p RSO 06/29/2012  . AODM 03/22/2008  . OTHER AND UNSPECIFIED HYPERLIPIDEMIA 03/22/2008   Past Medical History  Diagnosis Date  . Diabetes mellitus   . Diabetes   . MVP (mitral valve prolapse)   . Plantar fasciitis   . Hypercholesteremia   . Thyroid disease   . Complication of anesthesia   . PONV (postoperative nausea and vomiting)     2004- with back surgery  . Hypothyroidism   . Dysrhythmia     skip a beat  . Shortness of breath     with exertion  . Asthma     hx of   . Sleep apnea   . GERD (gastroesophageal reflux disease)   . Arthritis     Past Surgical History  Procedure Laterality Date  . Laparoscopic salpingo oopherectomy Right 04/14/1999  . Rotator cuff repair Bilateral 1999/2001  . Cesarean section    . Appendectomy    . Back surgery  06/2002    L5 HNP  . Cyst removal leg Left     thigh,Lt wrist  . Ganglion cyst excision Left     wrist  . Toe surgery    . Nasal septoplasty w/ turbinoplasty Bilateral 11/02/2012    Procedure: NASAL SEPTOPLASTY WITH BILATERAL INFERIOR TURBINATE REDUCTION;  Surgeon: Osborn Coho, MD;  Location:  [facility] OR;  Service: ENT;  Laterality: Bilateral;    ALLERGIES: Benadryl; Codeine; Erythromycin; and Nsaids  Current Outpatient Prescriptions  Medication Sig Dispense Refill  . atorvastatin (LIPITOR) 40 MG tablet Take 1 tablet by mouth daily.      . Calcium-Magnesium-Vitamin D (CALCIUM MAGNESIUM PO) Take 1 tablet by mouth 2 (two) times daily.      . cetirizine (ZYRTEC) 10 MG tablet Take 10 mg by mouth daily.      . Cholecalciferol (VITAMIN D3) 2000 UNITS TABS Take 2,000 Units by mouth daily.      . clonazePAM (KLONOPIN) 0.5 MG tablet Take 0.5 mg by mouth at bedtime.       . fish oil-omega-3 fatty acids 1000 MG capsule Take 1 g by mouth daily.      . Glucosamine Sulfate 750 MG CAPS Take 750 mg by mouth 2 (two) times daily.      Marland Kitchen levothyroxine (SYNTHROID, LEVOTHROID) 25 MCG  tablet Take 25 mcg by mouth daily.      Marland Kitchen lisinopril (PRINIVIL,ZESTRIL) 2.5 MG tablet Take 2.5 mg by mouth daily.      . metFORMIN (GLUMETZA) 500 MG (MOD) 24 hr tablet Take 1,000 mg by mouth daily.       . ONE TOUCH ULTRA TEST test strip as directed.      . sertraline (ZOLOFT) 100 MG tablet Take 50 mg by mouth every other day.       Marland Kitchen dexlansoprazole (DEXILANT) 60 MG capsule Take 60 mg by mouth daily.       No current facility-administered medications for this visit.     ROS:  Pertinent items are noted in HPI.  SOCIAL HISTORY:  Retired.   PHYSICAL EXAMINATION:    BP 122/74  Pulse 68  Ht 5' 4.5" (1.638 m)  Wt 169 lb (76.658 kg)  BMI 28.57 kg/m2  LMP 11/12/2010   Wt Readings from Last 3 Encounters:  07/05/13 169 lb (76.658 kg)  11/03/12 173 lb 1 oz (78.5 kg)  11/03/12 173 lb 1 oz (78.5 kg)     Ht Readings from Last 3 Encounters:  07/05/13 5' 4.5" (1.638 m)  11/02/12 5\' 5"  (1.651 m)  11/02/12 5\' 5"  (1.651 m)    General appearance: alert, cooperative and appears stated age Head: Normocephalic, without obvious abnormality, atraumatic Neck: no adenopathy, supple, symmetrical, trachea midline and thyroid not enlarged, symmetric, no tenderness/mass/nodules Lungs: clear to auscultation bilaterally Breasts: Inspection negative, No nipple retraction or dimpling, No nipple discharge or bleeding, No axillary or supraclavicular adenopathy, Normal to palpation without dominant masses Heart: regular rate and rhythm Abdomen: soft, non-tender; no masses,  no organomegaly Extremities: extremities normal, atraumatic, no cyanosis or edema Skin: Skin color, texture, turgor normal. No rashes or lesions Lymph nodes: Cervical, supraclavicular, and axillary nodes normal. No abnormal inguinal nodes palpated Neurologic: Grossly normal  Pelvic: External genitalia:  no lesions              Urethra:  normal appearing urethra with no masses, tenderness or lesions              Bartholins and Skenes:  normal                 Vagina: normal appearing vagina with normal color and discharge, no lesions              Cervix: normal appearance              Pap and high risk HPV testing done: no.  Bimanual Exam:  Uterus:  uterus is normal size, shape, consistency and nontender                                      Adnexa: normal adnexa in size, nontender and no masses                                      Rectovaginal: Confirms                                      Anus:  normal sphincter tone, no lesions  Wet prep - pH 5.5, negative for yeast, clue cells, and trichomonas.  ASSESSMENT  Normal gynecologic exam. Atrophic vaginitis. Rectal bleeding, bloating, constipation, and fecal incontinence.   PLAN  Mammogram recommended yearly.  Pap smear and high risk HPV testing not indicated.  Counseled on self breast exam. Refer to Dr. Loreta AveMann. I discussed vaginal hydration with H20 based products, olive oil and I discussed vaginal estrogen treatments, which the patient declines today.  Return annually or prn   An After Visit Summary was printed and given to the patient.

## 2013-07-05 NOTE — Telephone Encounter (Signed)
Spoke with patient to advise of appointment time with Dr.Mann for 3/26 at 9:00. Patient states that she already made and appointment for today 3/25 at 3:15 so that she would not have to make an extra trip. [facility] Patient will attend 3/25 appointment.  Routing to provider for final review. Patient agreeable to disposition. Will close encounter

## 2013-07-05 NOTE — Patient Instructions (Signed)
EXERCISE AND DIET:  We recommended that you start or continue a regular exercise program for good health. Regular exercise means any activity that makes your heart beat faster and makes you sweat.  We recommend exercising at least 30 minutes per day at least 3 days a week, preferably 4 or 5.  We also recommend a diet low in fat and sugar.  Inactivity, poor dietary choices and obesity can cause diabetes, heart attack, stroke, and kidney damage, among others.    ALCOHOL AND SMOKING:  Women should limit their alcohol intake to no more than 7 drinks/beers/glasses of wine (combined, not each!) per week. Moderation of alcohol intake to this level decreases your risk of breast cancer and liver damage. And of course, no recreational drugs are part of a healthy lifestyle.  And absolutely no smoking or even second hand smoke. Most people know smoking can cause heart and lung diseases, but did you know it also contributes to weakening of your bones? Aging of your skin?  Yellowing of your teeth and nails?  CALCIUM AND VITAMIN D:  Adequate intake of calcium and Vitamin D are recommended.  The recommendations for exact amounts of these supplements seem to change often, but generally speaking 600 mg of calcium (either carbonate or citrate) and 800 units of Vitamin D per day seems prudent. Certain women may benefit from higher intake of Vitamin D.  If you are among these women, your doctor will have told you during your visit.    PAP SMEARS:  Pap smears, to check for cervical cancer or precancers,  have traditionally been done yearly, although recent scientific advances have shown that most women can have pap smears less often.  However, every woman still should have a physical exam from her gynecologist every year. It will include a breast check, inspection of the vulva and vagina to check for abnormal growths or skin changes, a visual exam of the cervix, and then an exam to evaluate the size and shape of the uterus and  ovaries.  And after 57 years of age, a rectal exam is indicated to check for rectal cancers. We will also provide age appropriate advice regarding health maintenance, like when you should have certain vaccines, screening for sexually transmitted diseases, bone density testing, colonoscopy, mammograms, etc.   MAMMOGRAMS:  All women over 40 years old should have a yearly mammogram. Many facilities now offer a "3D" mammogram, which may cost around $50 extra out of pocket. If possible,  we recommend you accept the option to have the 3D mammogram performed.  It both reduces the number of women who will be called back for extra views which then turn out to be normal, and it is better than the routine mammogram at detecting truly abnormal areas.    COLONOSCOPY:  Colonoscopy to screen for colon cancer is recommended for all women at age 50.  We know, you hate the idea of the prep.  We agree, BUT, having colon cancer and not knowing it is worse!!  Colon cancer so often starts as a polyp that can be seen and removed at colonscopy, which can quite literally save your life!  And if your first colonoscopy is normal and you have no family history of colon cancer, most women don't have to have it again for 10 years.  Once every ten years, you can do something that may end up saving your life, right?  We will be happy to help you get it scheduled when you are ready.    Be sure to check your insurance coverage so you understand how much it will cost.  It may be covered as a preventative service at no cost, but you should check your particular policy.     Atrophic Vaginitis Atrophic vaginitis is a problem of low levels of estrogen in women. This problem can happen at any age. It is most common in women who have gone through menopause ("the change").  HOW WILL I KNOW IF I HAVE THIS PROBLEM? You may have:  Trouble with peeing (urinating), such as:  Going to the bathroom often.  A hard time holding your pee until you reach  a bathroom.  Leaking pee.  Having pain when you pee.  Itching or a burning feeling.  Vaginal bleeding and spotting.  Pain during sex.  Dryness of the vagina.  A yellow, bad-smelling fluid (discharge) coming from the vagina. HOW WILL MY DOCTOR CHECK FOR THIS PROBLEM?  During your exam, your doctor will likely find the problem.  If there is a vaginal fluid, it may be checked for infection. HOW WILL THIS PROBLEM BE TREATED? Keep the vulvar skin as clean as possible. Moisturizers and lubricants can help with some of the symptoms. Estrogen replacement can help. There are 2 ways to take estrogen:  Systemic estrogen gets estrogen to your whole body. It takes many weeks or months before the symptoms get better.  You take an estrogen pill.  You use a skin patch. This is a patch that you put on your skin.  If you still have your uterus, your doctor may ask you to take a hormone. Talk to your doctor about the right medicine for you.  Estrogen cream.  This puts estrogen only at the part of your body where you apply it. The cream is put into the vagina or put on the vulvar skin. For some women, estrogen cream works faster than pills or the patch. CAN ALL WOMEN WITH THIS PROBLEM USE ESTROGEN? No. Women with certain types of cancer, liver problems, or problems with blood clots should not take estrogen. Your doctor can help you decide the best treatment for your symptoms. Document Released: 09/16/2007 Document Revised: 06/22/2011 Document Reviewed: 09/16/2007 [facility] Patient Information 2014 [place], [place].  Estradiol vaginal tablets What is this medicine? ESTRADIOL (es tra DYE ole) vaginal tablet is used to help relieve symptoms of vaginal irritation and dryness that occurs in some women during menopause. This medicine may be used for other purposes; ask your health care provider or pharmacist if you have questions. COMMON BRAND NAME(S): Vagifem What should I tell my health care  provider before I take this medicine? They need to know if you have any of these conditions: -abnormal vaginal bleeding -blood vessel disease or blood clots -breast, cervical, endometrial, ovarian, liver, or uterine cancer -dementia -diabetes -gallbladder disease -heart disease or recent heart attack -high blood pressure -high cholesterol -high level of calcium in the blood -hysterectomy -kidney disease -liver disease -migraine headaches -protein C deficiency -protein S deficiency -stroke -systemic lupus erythematosus (SLE) -tobacco smoker -an unusual or allergic reaction to estrogens, other hormones, medicines, foods, dyes, or preservatives -pregnant or trying to get pregnant -breast-feeding How should I use this medicine? This medicine is only for use in the vagina. Do not take by mouth. Wash your hands before and after use. Read package directions carefully. Unwrap the pre-filled applicator package. Lie on your back, part and bend your knees. Gently insert the applicator tip high in the vagina and push the plunger  to release the tablet into the vagina. Gently remove the applicator. Throw away the applicator after use. Do not use your medicine more often than directed. Finish the full course prescribed by your doctor or health care professional even if you think your condition is better. Do not stop using except on the advice of your doctor or health care professional. Talk to your pediatrician regarding the use of this medicine in children. A patient package insert for the product will be given with each prescription and refill. Read this sheet carefully each time. The sheet may change frequently. Overdosage: If you think you have taken too much of this medicine contact a poison control center or emergency room at once. NOTE: This medicine is only for you. Do not share this medicine with others. What if I miss a dose? If you miss a dose, take it as soon as you can. If it is almost  time for your next dose, take only that dose. Do not take double or extra doses. What may interact with this medicine? Do not take this medicine with any of the following medications: -aromatase inhibitors like aminoglutethimide, anastrozole, exemestane, letrozole, testolactone This medicine may also interact with the following medications: -antibiotics used to treat tuberculosis like rifabutin, rifampin and rifapentene -raloxifene or tamoxifen -warfarin This list may not describe all possible interactions. Give your health care provider a list of all the medicines, herbs, non-prescription drugs, or dietary supplements you use. Also tell them if you smoke, drink alcohol, or use illegal drugs. Some items may interact with your medicine. What should I watch for while using this medicine? Visit your health care professional for regular checks on your progress. You will need a regular breast and pelvic exam. You should also discuss the need for regular mammograms with your health care professional, and follow his or her guidelines. This medicine can make your body retain fluid, making your fingers, hands, or ankles swell. Your blood pressure can go up. Contact your doctor or health care professional if you feel you are retaining fluid. If you have any reason to think you are pregnant; stop taking this medicine at once and contact your doctor or health care professional. Tobacco smoking increases the risk of getting a blood clot or having a stroke, especially if you are more than 57 years old. You are strongly advised not to smoke. If you wear contact lenses and notice visual changes, or if the lenses begin to feel uncomfortable, consult your eye care specialist. If you are going to have elective surgery, you may need to stop taking this medicine beforehand. Consult your health care professional for advice prior to scheduling the surgery. What side effects may I notice from receiving this medicine? Side  effects that you should report to your doctor or health care professional as soon as possible: -allergic reactions like skin rash, itching or hives, swelling of the face, lips, or tongue -breast tissue changes or discharge -changes in vision -chest pain -confusion, trouble speaking or understanding -dark urine -general ill feeling or flu-like symptoms -light-colored stools -nausea, vomiting -pain, swelling, warmth in the leg -right upper belly pain -severe headaches -shortness of breath -sudden numbness or weakness of the face, arm or leg -trouble walking, dizziness, loss of balance or coordination -unusual vaginal bleeding -yellowing of the eyes or skin Side effects that usually do not require medical attention (report to your doctor or health care professional if they continue or are bothersome): -hair loss -increased hunger or thirst -increased urination -  symptoms of vaginal infection like itching, irritation or unusual discharge -unusually weak or tired This list may not describe all possible side effects. Call your doctor for medical advice about side effects. You may report side effects to FDA at 1-800-FDA-1088. Where should I keep my medicine? Keep out of the reach of children. Store at room temperature between 15 and 30 degrees C (59 and 86 degrees F). Throw away any unused medicine after the expiration date. NOTE: This sheet is a summary. It may not cover all possible information. If you have questions about this medicine, talk to your doctor, pharmacist, or health care provider.  2014, [employer]. (2010-07-02 09:08:58)

## 2013-07-08 ENCOUNTER — Telehealth: Payer: Self-pay | Admitting: Internal Medicine

## 2013-07-08 NOTE — Telephone Encounter (Signed)
[place]und rxed by Dr. Loreta AveMann for back spasm  I Rxed methacarbamol 500 mg every 6 hrs prn # 90 no refill.

## 2013-09-06 ENCOUNTER — Encounter: Payer: Self-pay | Admitting: Obstetrics and Gynecology

## 2014-02-12 ENCOUNTER — Encounter: Payer: Self-pay | Admitting: Obstetrics and Gynecology

## 2014-05-07 ENCOUNTER — Telehealth: Payer: Self-pay | Admitting: Obstetrics and Gynecology

## 2014-05-07 NOTE — Telephone Encounter (Signed)
Order for BMD to Dr.Miller's desk for review and signature for fax as Dr.Silva is out of the office.

## 2014-05-07 NOTE — Telephone Encounter (Signed)
Pt need order for bone density test faxed to [facility]. Appointment date is 06/26/14.

## 2014-05-08 NOTE — Telephone Encounter (Signed)
Signed and back to you for faxing.

## 2014-05-08 NOTE — Telephone Encounter (Signed)
[facility] and [facility].  Routing to provider for final review. Patient agreeable to disposition. Will close encounter

## 2014-07-13 ENCOUNTER — Encounter: Payer: Self-pay | Admitting: Obstetrics and Gynecology

## 2014-07-13 ENCOUNTER — Ambulatory Visit (INDEPENDENT_AMBULATORY_CARE_PROVIDER_SITE_OTHER): Payer: BLUE CROSS/BLUE SHIELD | Admitting: Obstetrics and Gynecology

## 2014-07-13 VITALS — BP 110/70 | HR 70 | Resp 14 | Ht 64.0 in | Wt 154.2 lb

## 2014-07-13 DIAGNOSIS — Z780 Asymptomatic menopausal state: Secondary | ICD-10-CM | POA: Diagnosis not present

## 2014-07-13 DIAGNOSIS — M810 Age-related osteoporosis without current pathological fracture: Secondary | ICD-10-CM

## 2014-07-13 DIAGNOSIS — Z01419 Encounter for gynecological examination (general) (routine) without abnormal findings: Secondary | ICD-10-CM

## 2014-07-13 DIAGNOSIS — Z0000 Encounter for general adult medical examination without abnormal findings: Secondary | ICD-10-CM

## 2014-07-13 LAB — POCT URINALYSIS DIPSTICK
BILIRUBIN UA: NEGATIVE
GLUCOSE UA: NEGATIVE
KETONES UA: NEGATIVE
Leukocytes, UA: NEGATIVE
NITRITE UA: NEGATIVE
PH UA: 5
Protein, UA: NEGATIVE
RBC UA: NEGATIVE
Urobilinogen, UA: NEGATIVE

## 2014-07-13 NOTE — Patient Instructions (Signed)
EXERCISE AND DIET:  We recommended that you start or continue a regular exercise program for good health. Regular exercise means any activity that makes your heart beat faster and makes you sweat.  We recommend exercising at least 30 minutes per day at least 3 days a week, preferably 4 or 5.  We also recommend a diet low in fat and sugar.  Inactivity, poor dietary choices and obesity can cause diabetes, heart attack, stroke, and kidney damage, among others.    ALCOHOL AND SMOKING:  Women should limit their alcohol intake to no more than 7 drinks/beers/glasses of wine (combined, not each!) per week. Moderation of alcohol intake to this level decreases your risk of breast cancer and liver damage. And of course, no recreational drugs are part of a healthy lifestyle.  And absolutely no smoking or even second hand smoke. Most people know smoking can cause heart and lung diseases, but did you know it also contributes to weakening of your bones? Aging of your skin?  Yellowing of your teeth and nails?  CALCIUM AND VITAMIN D:  Adequate intake of calcium and Vitamin D are recommended.  The recommendations for exact amounts of these supplements seem to change often, but generally speaking 600 mg of calcium (either carbonate or citrate) and 800 units of Vitamin D per day seems prudent. Certain women may benefit from higher intake of Vitamin D.  If you are among these women, your doctor will have told you during your visit.    PAP SMEARS:  Pap smears, to check for cervical cancer or precancers,  have traditionally been done yearly, although recent scientific advances have shown that most women can have pap smears less often.  However, every woman still should have a physical exam from her gynecologist every year. It will include a breast check, inspection of the vulva and vagina to check for abnormal growths or skin changes, a visual exam of the cervix, and then an exam to evaluate the size and shape of the uterus and  ovaries.  And after 58 years of age, a rectal exam is indicated to check for rectal cancers. We will also provide age appropriate advice regarding health maintenance, like when you should have certain vaccines, screening for sexually transmitted diseases, bone density testing, colonoscopy, mammograms, etc.   MAMMOGRAMS:  All women over 40 years old should have a yearly mammogram. Many facilities now offer a "3D" mammogram, which may cost around $50 extra out of pocket. If possible,  we recommend you accept the option to have the 3D mammogram performed.  It both reduces the number of women who will be called back for extra views which then turn out to be normal, and it is better than the routine mammogram at detecting truly abnormal areas.    COLONOSCOPY:  Colonoscopy to screen for colon cancer is recommended for all women at age 50.  We know, you hate the idea of the prep.  We agree, BUT, having colon cancer and not knowing it is worse!!  Colon cancer so often starts as a polyp that can be seen and removed at colonscopy, which can quite literally save your life!  And if your first colonoscopy is normal and you have no family history of colon cancer, most women don't have to have it again for 10 years.  Once every ten years, you can do something that may end up saving your life, right?  We will be happy to help you get it scheduled when you are ready.    Be sure to check your insurance coverage so you understand how much it will cost.  It may be covered as a preventative service at no cost, but you should check your particular policy.     Risedronate tablets What is this medicine? RISEDRONATE (ris ED roe nate) reduces calcium loss from bones. It helps make healthy bone and to slow bone loss in patients with Paget's disease and osteoporosis. It may be used in others at risk for bone loss. This medicine may be used for other purposes; ask your health care provider or pharmacist if you have questions. COMMON  BRAND NAME(S): Actonel What should I tell my health care provider before I take this medicine? They need to know if you have any of these conditions: -dental disease -esophagus, stomach, or intestine problems, like acid reflux or GERD -kidney disease -low blood calcium -problems sitting or standing for 30 minutes -trouble swallowing -an unusual or allergic reaction to risedronate, other medicines, foods, dyes, or preservatives -pregnant or trying to get pregnant -breast-feeding How should I use this medicine? You must take this medication exactly as directed or you will lower the amount of medicine you absorb into your body or you may cause your self harm. Take this medicine by mouth first thing in the morning, after you are up for the day. Do not eat or drink anything before you take this medicine. Swallow the tablets with a full glass (6 to 8 fluid ounces) of plain water. Do not take the tablets with any other drink. Do not chew or crush the tablet. After taking this medicine, do not eat breakfast, drink, or take any other medicines or vitamins for at least 30 minutes. Stand or sit up for at least 30 minutes after you take this medicine; do not lie down. Do not take your medicine more often than directed. Talk to your pediatrician regarding the use of this medicine in children. Special care may be needed. Overdosage: If you think you have taken too much of this medicine contact a poison control center or emergency room at once. NOTE: This medicine is only for you. Do not share this medicine with others. What if I miss a dose? If you miss a dose, do not take it later in the day. Take your normal dose the next morning. Do not take double or extra doses. What may interact with this medicine? -antacids like aluminum hydroxide or magnesium hydroxide -aspirin -calcium supplements -iron supplements -NSAIDs, medicines for pain and inflammation, like ibuprofen or naproxen -thyroid  hormones -vitamins with minerals This list may not describe all possible interactions. Give your health care provider a list of all the medicines, herbs, non-prescription drugs, or dietary supplements you use. Also tell them if you smoke, drink alcohol, or use illegal drugs. Some items may interact with your medicine. What should I watch for while using this medicine? Visit your doctor or health care professional for regular check ups. It may be some time before you see the benefit from this medicine. Your doctor or health care professional may order blood tests and other tests to see how you are doing. You should make sure you get enough calcium and vitamin D while you are taking this medicine, unless your doctor tells you not to. Discuss the foods you eat and the vitamins you take with your health care professional. Some people who take this medicine have severe bone, joint, and/or muscle pain. This medicine may also increase your risk for a broken thigh bone. Tell   your doctor right away if you have pain in your upper leg or groin. Tell your doctor if you have any pain that does not go away or that gets worse. What side effects may I notice from receiving this medicine? Side effects that you should report to your doctor as soon as possible: -allergic reactions such as skin rash or itching, hives, swelling of the face, lips, throat, or tongue -black or tarry stools -changes in vision -heartburn or stomach pain -jaw pain, especially after dental work -pain or difficulty when swallowing -redness, blistering, peeling, or loosening of the skin, including inside the mouth Side effects that usually do not require medical attention (report to your doctor if they continue or are bothersome): -bone, muscle, or joint pain -changes in taste -diarrhea or constipation -eye pain or itching -headache -nausea or vomiting -stomach gas or fullness This list may not describe all possible side effects. Call  your doctor for medical advice about side effects. You may report side effects to FDA at 1-800-FDA-1088. Where should I keep my medicine? Keep out of the reach of children. Store at room temperature between 20 and 25 degrees C (68 and 77 degrees F). Throw away any unused medicine after the expiration date. NOTE: This sheet is a summary. It may not cover all possible information. If you have questions about this medicine, talk to your doctor, pharmacist, or health care provider.  2015, [employer] Standard. (2012-02-19 16:21:37)  Raloxifene tablets What is this medicine? RALOXIFENE (ral OX i feen) reduces the amount of calcium lost from bones. It is used to treat and prevent osteoporosis in women who have experienced menopause. This medicine may be used for other purposes; ask your health care provider or pharmacist if you have questions. COMMON BRAND NAME(S): Evista What should I tell my health care provider before I take this medicine? They need to know if you have any of these conditions: -a history of blood clots -cancer -heart failure -liver disease -premenopausal -an unusual or allergic reaction to raloxifene, other medicines, foods, dyes, or preservatives -pregnant or trying to get pregnant -breast-feeding How should I use this medicine? Take this medicine by mouth with a glass of water. Follow the directions on the prescription label. The tablets can be taken with or without food. Take your doses at regular intervals. Do not take your medicine more often than directed. Talk to your pediatrician regarding the use of this medicine in children. Special care may be needed. Overdosage: If you think you have taken too much of this medicine contact a poison control center or emergency room at once. NOTE: This medicine is only for you. Do not share this medicine with others. What if I miss a dose? If you miss a dose, take it as soon as you can. If it is almost time for your next dose,  take only that dose. Do not take double or extra doses. What may interact with this medicine? -ampicillin -cholestyramine -colestipol -diazepam -diazoxide -female hormones like hormone replacement therapy -lidocaine -warfarin This list may not describe all possible interactions. Give your health care provider a list of all the medicines, herbs, non-prescription drugs, or dietary supplements you use. Also tell them if you smoke, drink alcohol, or use illegal drugs. Some items may interact with your medicine. What should I watch for while using this medicine? Visit your doctor or health care professional for regular checks on your progress. Do not stop taking this medicine except on the advice of your doctor or health  care professional. Bonita Quin should make sure you get enough calcium and vitamin D in your diet while you are taking this medicine. Discuss your dietary needs with your health care professional or nutritionist. Exercise may help to prevent bone loss. Discuss your exercise needs with your doctor or health care professional. This medicine can rarely cause blood clots. You should avoid long periods of bed rest while taking this medicine. If you are going to have surgery, tell your doctor or health care professional that you are taking this medicine. This medicine should be stopped at least 3 days before surgery. After surgery, it should be restarted only after you are walking again. It should not be restarted while you still need long periods of bed rest. You should not smoke while taking this medicine. Smoking may also increase your risk of blood clots. Smoking can also decrease the effects of this medicine. This medicine does not prevent hot flashes. It may cause hot flashes in some patients at the start of therapy. What side effects may I notice from receiving this medicine? Side effects that you should report to your doctor or health care professional as soon as possible: -change in  vision -chest pain -difficulty breathing -leg pain or swelling -skin rash, itching Side effects that usually do not require medical attention (report to your doctor or health care professional if they continue or are bothersome): -fluid build-up -leg cramps -stomach pain -sweating This list may not describe all possible side effects. Call your doctor for medical advice about side effects. You may report side effects to FDA at 1-800-FDA-1088. Where should I keep my medicine? Keep out of the reach of children. Store at room temperature between 15 and 30 degrees C (59 and 86 degrees F). Throw away any unused medicine after the expiration date. NOTE: This sheet is a summary. It may not cover all possible information. If you have questions about this medicine, talk to your doctor, pharmacist, or health care provider.  2015, [employer] Standard. (2008-03-15 15:15:14)  Calcitonin nasal spray What is this medicine? CALCITONIN (kal si TOE nin) stops bone loss and breaks. It will make your bones more dense, or thicker. This medicine is used to treat postmenopausal osteoporosis. This medicine may be used for other purposes; ask your health care provider or pharmacist if you have questions. COMMON BRAND NAME(S): Fortical, Miacalcin What should I tell my health care provider before I take this medicine? They need to know if you have any of these conditions: -nasal sores or trauma -an unusual or allergic reaction to calcitonin, fish, other medicines, foods, dyes, or preservatives -pregnant or trying to get pregnant -breast-feeding How should I use this medicine? This medicine is only for use in the nose. Do not take by mouth. Follow the directions on your prescription label. Alternate nostrils daily. Do not use more often than directed. Make sure that you are using your nasal spray correctly. Ask you doctor or health care provider if you have any questions. Talk to your pediatrician regarding the  use of this medicine in children. Special care may be needed. Overdosage: If you think you have taken too much of this medicine contact a poison control center or emergency room at once. NOTE: This medicine is only for you. Do not share this medicine with others. What if I miss a dose? If you miss a dose, use it as soon as you can. If it is almost time for your next dose, use only that dose. Do not use double  or extra doses. What may interact with this medicine? -prior bone scan with diphosphonate in patient with Paget's disease This list may not describe all possible interactions. Give your health care provider a list of all the medicines, herbs, non-prescription drugs, or dietary supplements you use. Also tell them if you smoke, drink alcohol, or use illegal drugs. Some items may interact with your medicine. What should I watch for while using this medicine? Visit your doctor or health care professional for regular checks on your progress. Your doctor will need to exam the inside of your nose before you start using the medicine and periodically during use. Talk to your doctor about your risk of cancer. You may be more at risk for certain types of cancers if you take this medicine. Be sure that you get at least 1000 mg of calcium and 400 I.U. of vitamin D while you are taking this medicine. This will help the medicine work better. Ask your doctor or health care provider for good nutrition advice. What side effects may I notice from receiving this medicine? Side effects that you should report to your doctor or health care professional as soon as possible: -allergic reactions like skin rash, itching or hives, swelling of the face, lips, or tongue -breathing problems -chest pain, tightness -dizziness -infection or fever -nosebleed or nose sores -tingling in the hands or feet Side effects that usually do not require medical attention (report to your doctor or health care professional if they  continue or are bothersome): -back or joint pain -flushing -headache -nausea or upset stomach -runny, stuffy, or irritated nose -tremors -watery eyes This list may not describe all possible side effects. Call your doctor for medical advice about side effects. You may report side effects to FDA at 1-800-FDA-1088. Where should I keep my medicine? Keep out of the reach of children. Store the unopened medicine in a refrigerator between 2 and 8 degrees C (36 and 46 degrees F). Do not freeze. Once opened store at room temperature between 15 and 30 degrees C (59 and 86 degrees F) in an upright position for up to 35 days. Throw away any unused medicine after the expiration date. NOTE: This sheet is a summary. It may not cover all possible information. If you have questions about this medicine, talk to your doctor, pharmacist, or health care provider.  2015, [employer] Standard. (2012-06-23 12:11:43)  Denosumab injection What is this medicine? DENOSUMAB (den oh sue mab) slows bone breakdown. Prolia is used to treat osteoporosis in women after menopause and in men. Rivka Barbara is used to prevent bone fractures and other bone problems caused by cancer bone metastases. Rivka Barbara is also used to treat giant cell tumor of the bone. This medicine may be used for other purposes; ask your health care provider or pharmacist if you have questions. COMMON BRAND NAME(S): Prolia, XGEVA What should I tell my health care provider before I take this medicine? They need to know if you have any of these conditions: -dental disease -eczema -infection or history of infections -kidney disease or on dialysis -low blood calcium or vitamin D -malabsorption syndrome -scheduled to have surgery or tooth extraction -taking medicine that contains denosumab -thyroid or parathyroid disease -an unusual reaction to denosumab, other medicines, foods, dyes, or preservatives -pregnant or trying to get pregnant -breast-feeding How  should I use this medicine? This medicine is for injection under the skin. It is given by a health care professional in a hospital or clinic setting. If you are getting  Prolia, a special MedGuide will be given to you by the pharmacist with each prescription and refill. Be sure to read this information carefully each time. For Prolia, talk to your pediatrician regarding the use of this medicine in children. Special care may be needed. For Rivka Barbara, talk to your pediatrician regarding the use of this medicine in children. While this drug may be prescribed for children as young as 13 years for selected conditions, precautions do apply. Overdosage: If you think you've taken too much of this medicine contact a poison control center or emergency room at once. Overdosage: If you think you have taken too much of this medicine contact a poison control center or emergency room at once. NOTE: This medicine is only for you. Do not share this medicine with others. What if I miss a dose? It is important not to miss your dose. Call your doctor or health care professional if you are unable to keep an appointment. What may interact with this medicine? Do not take this medicine with any of the following medications: -other medicines containing denosumab This medicine may also interact with the following medications: -medicines that suppress the immune system -medicines that treat cancer -steroid medicines like prednisone or cortisone This list may not describe all possible interactions. Give your health care provider a list of all the medicines, herbs, non-prescription drugs, or dietary supplements you use. Also tell them if you smoke, drink alcohol, or use illegal drugs. Some items may interact with your medicine. What should I watch for while using this medicine? Visit your doctor or health care professional for regular checks on your progress. Your doctor or health care professional may order blood tests and other  tests to see how you are doing. Call your doctor or health care professional if you get a cold or other infection while receiving this medicine. Do not treat yourself. This medicine may decrease your body's ability to fight infection. You should make sure you get enough calcium and vitamin D while you are taking this medicine, unless your doctor tells you not to. Discuss the foods you eat and the vitamins you take with your health care professional. See your dentist regularly. Brush and floss your teeth as directed. Before you have any dental work done, tell your dentist you are receiving this medicine. Do not become pregnant while taking this medicine or for 5 months after stopping it. Women should inform their doctor if they wish to become pregnant or think they might be pregnant. There is a potential for serious side effects to an unborn child. Talk to your health care professional or pharmacist for more information. What side effects may I notice from receiving this medicine? Side effects that you should report to your doctor or health care professional as soon as possible: -allergic reactions like skin rash, itching or hives, swelling of the face, lips, or tongue -breathing problems -chest pain -fast, irregular heartbeat -feeling faint or lightheaded, falls -fever, chills, or any other sign of infection -muscle spasms, tightening, or twitches -numbness or tingling -skin blisters or bumps, or is dry, peels, or red -slow healing or unexplained pain in the mouth or jaw -unusual bleeding or bruising Side effects that usually do not require medical attention (Report these to your doctor or health care professional if they continue or are bothersome.): -muscle pain -stomach upset, gas This list may not describe all possible side effects. Call your doctor for medical advice about side effects. You may report side effects to FDA at  1-800-FDA-1088. Where should I keep my medicine? This medicine is  only given in a clinic, doctor's office, or other health care setting and will not be stored at home. NOTE: This sheet is a summary. It may not cover all possible information. If you have questions about this medicine, talk to your doctor, pharmacist, or health care provider.  2015, [employer] Standard. (2011-09-28 12:37:47)

## 2014-07-13 NOTE — Progress Notes (Addendum)
Patient ID: Alexandria Parker, female   DOB: [birth year], 59 y.o.   MRN: [id number] 59 y.o. G3P2 MarriedCaucasianF here for annual exam.   LMP 2011.  Spotting 2012.   Retired now.  Walking more now.  Does Yoga, but having rotator cuff issues.  Doing injections.  Less stress.   Patient is off Nexium now for reflux.  Has taken multiple meds for this.  Takes a lot of supplements - [facility] in [place].  Not taking any HRT currerntly.  Took in the past, and is reconsidering this through [facility].  TFTs are normal.   New diagnosis of osteoporosis.  Some back pain that goes away with rest.   PCP:  Marlaine Hind, MD [place], [place])  Patient's last menstrual period was 11/12/2010.          Sexually active: Yes.   female partner The current method of family planning is vasectomy/postmenopausal.    Exercising: Yes.    walking. Smoker:  no  Health Maintenance: Pap:  05-12-11 wnl:neg HR HPV History of abnormal Pap:  no MMG:  06-26-2014 per pt. Normal:[facility] Colonoscopy: 06/2013 normal/hx colon polyps with Dr. Loreta Ave.  Next due 06/2023 per patient.   Asking for a review of her colonoscopy report with me.  BMD:   06-26-14 Osteoporosis:[facility].  T score of spine -2.6, T score of left hip - 1.5, T score of right hip - 1.6 TDaP:  2007 Screening Labs:  , Hb today: PCP, Urine today: Neg   reports that she has never smoked. She does not have any smokeless tobacco history on file. She reports that she drinks about 1.2 oz of alcohol per week. She reports that she does not use illicit drugs.  Past Medical History  Diagnosis Date  . Diabetes mellitus   . Diabetes   . MVP (mitral valve prolapse)   . Plantar fasciitis   . Hypercholesteremia   . Thyroid disease   . Complication of anesthesia   . PONV (postoperative nausea and vomiting)     2004- with back surgery  . Hypothyroidism   . Dysrhythmia     skip a beat  . Shortness of breath     with exertion  . Asthma     hx of   . Sleep  apnea   . GERD (gastroesophageal reflux disease)   . Arthritis     Past Surgical History  Procedure Laterality Date  . Laparoscopic salpingo oopherectomy Right 04/14/1999  . Rotator cuff repair Bilateral 1999/2001  . Cesarean section    . Appendectomy    . Back surgery  06/2002    L5 HNP  . Cyst removal leg Left     thigh,Lt wrist  . Ganglion cyst excision Left     wrist  . Toe surgery    . Nasal septoplasty w/ turbinoplasty Bilateral 11/02/2012    Procedure: NASAL SEPTOPLASTY WITH BILATERAL INFERIOR TURBINATE REDUCTION;  Surgeon: Osborn Coho, MD;  Location: [facility] OR;  Service: ENT;  Laterality: Bilateral;    Current Outpatient Prescriptions  Medication Sig Dispense Refill  . Thyroid 48.75 MG TABS Take 1 tablet by mouth daily.    . Calcium-Magnesium-Vitamin D (CALCIUM MAGNESIUM PO) Take 1 tablet by mouth 2 (two) times daily.    . Cholecalciferol (VITAMIN D3) 2000 UNITS TABS Take 2,000 Units by mouth daily.    . clonazePAM (KLONOPIN) 0.5 MG tablet Take 0.5 mg by mouth at bedtime.     . fish oil-omega-3 fatty acids 1000 MG capsule Take 1  g by mouth daily.    . Glucosamine Sulfate 750 MG CAPS Take 750 mg by mouth 2 (two) times daily.    Marland Kitchen. lisinopril (PRINIVIL,ZESTRIL) 2.5 MG tablet Take 2.5 mg by mouth daily.    . metFORMIN (GLUMETZA) 500 MG (MOD) 24 hr tablet Take 1,000 mg by mouth daily.     . ONE TOUCH ULTRA TEST test strip as directed.     No current facility-administered medications for this visit.    Family History  Problem Relation Age of Onset  . Diabetes Mother   . Hypertension Father     ROS:  Pertinent items are noted in HPI.  Otherwise, a comprehensive ROS was negative.  Exam:   BP 110/70 mmHg  Pulse 70  Resp 14  Ht 5\' 4"  (1.626 m)  Wt 154 lb 3.2 oz (69.945 kg)  BMI 26.46 kg/m2  LMP 11/12/2010     Height: 5\' 4"  (162.6 cm)  Ht Readings from Last 3 Encounters:  07/13/14 5\' 4"  (1.626 m)  07/05/13 5' 4.5" (1.638 m)  06/29/12 5' 4.25" (1.632 m)    General  appearance: alert, cooperative and appears stated age Head: Normocephalic, without obvious abnormality, atraumatic Neck: no adenopathy, supple, symmetrical, trachea midline and thyroid normal to inspection and palpation Lungs: clear to auscultation bilaterally Breasts: normal appearance, no masses or tenderness, Inspection negative, No nipple retraction or dimpling, No nipple discharge or bleeding, No axillary or supraclavicular adenopathy Heart: regular rate and rhythm Abdomen: soft, non-tender; bowel sounds normal; no masses,  no organomegaly Extremities: extremities normal, atraumatic, no cyanosis or edema Skin: Skin color, texture, turgor normal. No rashes or lesions Lymph nodes: Cervical, supraclavicular, and axillary nodes normal. No abnormal inguinal nodes palpated Neurologic: Grossly normal   Pelvic: External genitalia:  no lesions              Urethra:  normal appearing urethra with no masses, tenderness or lesions              Bartholins and Skenes: normal                 Vagina: normal appearing vagina with normal color and discharge, no lesions              Cervix: no lesions              Pap taken: Yes.   Bimanual Exam:  Uterus:  normal size, contour, position, consistency, mobility, non-tender              Adnexa: normal adnexa and no mass, fullness, tenderness               Rectovaginal: Confirms               Anus:  normal sphincter tone, no lesions  Chaperone was present for exam.  A:  Well Woman visit Osteoporosis of spine.  New diagnosis.   Menopausal status.  Patient considering HRT.  Reflux.   P:   Mammogram recommended yearly.  pap smear and HR HPV performed.  In depth discussion regarding osteoporosis - risk factors, lifestyle issues related to osteoporosis, fall prevention, bone density report and frequency of study, treatment options - bisphosphonates, Evista, calcitonin, Prolia, Forteo.  Patient would like to study options and contact me back regarding her  choice.  Written information on treatment choices to patient.  Will do a bone density study in a minimum of two years.  In depth discussion regarding HRT.  Women's Health initiative discussed and risks/benefits  related to restarting HRT after menopause.   HRT does not treat osteoporosis.  Do self breast exam.  Colonoscopy report reviewed.  return annually or prn  15 additional minutes spent discussing new osteoporosis and menopause/HRT.  Over 50% was spent in counseling.

## 2014-07-17 LAB — IPS PAP TEST WITH HPV

## 2015-03-13 ENCOUNTER — Other Ambulatory Visit: Payer: Self-pay | Admitting: Family Medicine

## 2015-03-13 DIAGNOSIS — E039 Hypothyroidism, unspecified: Secondary | ICD-10-CM

## 2015-03-13 DIAGNOSIS — E041 Nontoxic single thyroid nodule: Secondary | ICD-10-CM

## 2015-03-14 ENCOUNTER — Ambulatory Visit
Admission: RE | Admit: 2015-03-14 | Discharge: 2015-03-14 | Disposition: A | Discharge status: Home / Self Care | Payer: BLUE CROSS/BLUE SHIELD | Source: Ambulatory Visit | Attending: Family Medicine | Admitting: Family Medicine

## 2015-03-14 DIAGNOSIS — E041 Nontoxic single thyroid nodule: Secondary | ICD-10-CM

## 2015-03-14 DIAGNOSIS — E039 Hypothyroidism, unspecified: Secondary | ICD-10-CM

## 2015-03-15 ENCOUNTER — Other Ambulatory Visit: Payer: Self-pay

## 2015-05-10 ENCOUNTER — Telehealth: Payer: Self-pay | Admitting: Obstetrics and Gynecology

## 2015-05-10 NOTE — Telephone Encounter (Signed)
Thank you for the update.  Encounter closed. 

## 2015-05-10 NOTE — Telephone Encounter (Signed)
Patient did not reschedule her cancelled/rescheduled appointment for April because she moved out of area. Taken out of recall.

## 2015-07-17 ENCOUNTER — Ambulatory Visit: Payer: BLUE CROSS/BLUE SHIELD | Admitting: Obstetrics and Gynecology

## 2015-08-28 ENCOUNTER — Ambulatory Visit (INDEPENDENT_AMBULATORY_CARE_PROVIDER_SITE_OTHER): Payer: BLUE CROSS/BLUE SHIELD | Admitting: Nurse Practitioner

## 2015-08-28 ENCOUNTER — Encounter: Payer: Self-pay | Admitting: Nurse Practitioner

## 2015-08-28 VITALS — BP 98/60 | HR 76 | Resp 16 | Ht 64.5 in | Wt 141.0 lb

## 2015-08-28 DIAGNOSIS — R8290 Unspecified abnormal findings in urine: Secondary | ICD-10-CM | POA: Diagnosis not present

## 2015-08-28 DIAGNOSIS — Z01419 Encounter for gynecological examination (general) (routine) without abnormal findings: Secondary | ICD-10-CM

## 2015-08-28 DIAGNOSIS — M810 Age-related osteoporosis without current pathological fracture: Secondary | ICD-10-CM

## 2015-08-28 DIAGNOSIS — Z0000 Encounter for general adult medical examination without abnormal findings: Secondary | ICD-10-CM

## 2015-08-28 DIAGNOSIS — Z780 Asymptomatic menopausal state: Secondary | ICD-10-CM | POA: Diagnosis not present

## 2015-08-28 LAB — POCT URINALYSIS DIPSTICK
BILIRUBIN UA: NEGATIVE
Blood, UA: NEGATIVE
Glucose, UA: NEGATIVE
KETONES UA: NEGATIVE
NITRITE UA: NEGATIVE
PH UA: 5
Protein, UA: NEGATIVE
Urobilinogen, UA: NEGATIVE

## 2015-08-28 NOTE — Patient Instructions (Signed)

## 2015-08-28 NOTE — Progress Notes (Signed)
60 y.o. G3P2 Married  Caucasian Fe here for annual exam.  Now on bio identical HRT since last summer.  Now retired. Husband Agata Lucente has a jewelry shop in the basement and can do a lot of work from home.  [place].  New PCP is in [facility].  Father diagnosed with melanoma inside of nose, he may need radiation treatments.  Last Vit d was normal at [facility].  Overall she feels well and has lost weight since last year.  She feels that she is eating healthier.  She has decided against treatment for Osteoporosis and is doing exercise wit upper body weights and yoga.  Recently had problems with her shoulder so has decreased some weights.  Patient's last menstrual period was 11/12/2010.          Sexually active: Yes.    The current method of family planning is post menopausal status.    Exercising: No.  The patient does not participate in regular exercise at present. Smoker:  no  Health Maintenance: Pap:  07/13/14 pap and HR HPV neg MMG:  07/25/15 as per patient - copy went to PCP and [facility] Colonoscopy:  08/16/13 normal repeat in 10 yr. BMD:   06-26-14 Osteoporosis:[facility]. T score of spine -2.6, T score of left hip - 1.5, T score of right hip - 1.6 TDaP:  2015 Hep C and HIV: done at PCP Labs: PCP  Urine: Trace of Leukocytes - asymptomatic   reports that she has never smoked. She does not have any smokeless tobacco history on file. She reports that she drinks about 1.2 oz of alcohol per week. She reports that she does not use illicit drugs.  Past Medical History  Diagnosis Date  . Diabetes mellitus   . Diabetes (HCC)   . MVP (mitral valve prolapse)   . Plantar fasciitis   . Hypercholesteremia   . Thyroid disease   . Complication of anesthesia   . PONV (postoperative nausea and vomiting)     2004- with back surgery  . Hypothyroidism   . Dysrhythmia     skip a beat  . Shortness of breath     with exertion  . Asthma     hx of   . Sleep apnea    . GERD (gastroesophageal reflux disease)   . Arthritis     Past Surgical History  Procedure Laterality Date  . Laparoscopic salpingo oopherectomy Right 04/14/1999  . Rotator cuff repair Bilateral 1999/2001  . Cesarean section    . Appendectomy    . Back surgery  06/2002    L5 HNP  . Cyst removal leg Left     thigh,Lt wrist  . Ganglion cyst excision Left     wrist  . Toe surgery    . Nasal septoplasty w/ turbinoplasty Bilateral 11/02/2012    Procedure: NASAL SEPTOPLASTY WITH BILATERAL INFERIOR TURBINATE REDUCTION;  Surgeon: Osborn Coho, MD;  [facility];  Service: ENT;  Laterality: Bilateral;    Current Outpatient Prescriptions  Medication Sig Dispense Refill  . B-12, Methylcobalamin, 1000 MCG SUBL Place under the tongue daily.    Claris Gower Grape-Goldenseal (BERBERINE COMPLEX PO) Take 450 mg by mouth 2 (two) times daily.    . Calcium-Magnesium-Vitamin D (CALCIUM MAGNESIUM PO) Take 1 tablet by mouth 2 (two) times daily.    . Cholecalciferol (VITAMIN D3) 2000 UNITS TABS Take 2,000 Units by mouth daily.    . clonazePAM (KLONOPIN) 0.5 MG tablet Take 0.5  mg by mouth at bedtime.     Marland Kitchen estradiol (VIVELLE-DOT) 0.0375 MG/24HR Sunday/thursday (change sides)    . fish oil-omega-3 fatty acids 1000 MG capsule Take 1 g by mouth daily.    . Glucosamine Sulfate 750 MG CAPS Take 750 mg by mouth 2 (two) times daily.    Marland Kitchen lisinopril (PRINIVIL,ZESTRIL) 2.5 MG tablet Take 2.5 mg by mouth daily.    . Menaquinone-7 (VITAMIN K2 PO) Take 150 mcg by mouth daily.    . metFORMIN (GLUMETZA) 500 MG (MOD) 24 hr tablet Take 1,000 mg by mouth daily.     Marland Kitchen NATURE-THROID 32.5 MG tablet Take 32.5 mg by mouth daily. Take one tablet by mouth every morning before breakfast    . ONE TOUCH ULTRA TEST test strip as directed.    . Pregnenolone Micronized POWD 75 mg by Does not apply route. Take once daily; Monday, Wednesday, Friday take twice daily    . progesterone (PROMETRIUM) 100 MG capsule Take 100 mg by  mouth at bedtime. Take 3 tablets by mouth at bedtime    . sertraline (ZOLOFT) 50 MG tablet Take 50 mg by mouth daily. Take 1/2 tablet by mouth once daily     No current facility-administered medications for this visit.    Family History  Problem Relation Age of Onset  . Diabetes Mother   . Hypertension Father     ROS:  Pertinent items are noted in HPI.  Otherwise, a comprehensive ROS was negative.  Exam:   BP 98/60 mmHg  Pulse 76  Resp 16  Ht 5' 4.5" (1.638 m)  Wt 141 lb (63.957 kg)  BMI 23.84 kg/m2  LMP 11/12/2010 Height: 5' 4.5" (163.8 cm) Ht Readings from Last 3 Encounters:  08/28/15 5' 4.5" (1.638 m)  07/13/14 5\' 4"  (1.626 m)  07/05/13 5' 4.5" (1.638 m)    General appearance: alert, cooperative and appears stated age Head: Normocephalic, without obvious abnormality, atraumatic Neck: no adenopathy, supple, symmetrical, trachea midline and thyroid normal to inspection and palpation Lungs: clear to auscultation bilaterally Breasts: normal appearance, no masses or tenderness Heart: regular rate and rhythm Abdomen: soft, non-tender; no masses,  no organomegaly Extremities: extremities normal, atraumatic, no cyanosis or edema Skin: Skin color, texture, turgor normal. No rashes or lesions Lymph nodes: Cervical, supraclavicular, and axillary nodes normal. No abnormal inguinal nodes palpated Neurologic: Grossly normal   Pelvic: External genitalia:  no lesions              Urethra:  normal appearing urethra with no masses, tenderness or lesions              Bartholin's and Skene's: normal                 Vagina: normal appearing vagina with normal color and discharge, no lesions              Cervix: anteverted              Pap taken: No. Bimanual Exam:  Uterus:  normal size, contour, position, consistency, mobility, non-tender              Adnexa: no mass, fullness, tenderness               Rectovaginal: Confirms               Anus:  normal sphincter tone, no  lesions  Chaperone present: yes  A:  Well Woman with normal exam  Postmenopausal - back on HRT via [facility]  Hypothyroid now on nature thyroid with [facility]  DM - with normal HGB AIC at 5.3, HTN  Osteoporosis of the spine and trying to increase upper body exercise     P:   Reviewed health and wellness pertinent to exam  Pap smear as above  Mammogram is due 07/2016  No med's given here - PCP and [facility] take care of med's  Counseled with risk of DVT, CVA, cancer, etc. counseled on breast self exam, mammography screening, use and side effects of HRT, adequate intake of calcium and vitamin D, diet and exercise, Kegel's exercises return annually or prn  An After Visit Summary was printed and given to the patient.

## 2015-08-29 LAB — URINE CULTURE: Colony Count: 50000

## 2015-09-01 NOTE — Progress Notes (Signed)
Encounter reviewed by Dr. Brook Amundson C. Silva.  

## 2016-08-28 ENCOUNTER — Ambulatory Visit: Payer: BLUE CROSS/BLUE SHIELD | Admitting: Nurse Practitioner
